# Patient Record
Sex: Female | Born: 1970 | Race: White | Hispanic: No | Marital: Married | Smoking: Never smoker
Health system: Southern US, Community
[De-identification: ages and names within clinical notes are randomized; demographics above are authoritative.]

## PROBLEM LIST (undated history)

## (undated) DIAGNOSIS — M459 Ankylosing spondylitis of unspecified sites in spine: Secondary | ICD-10-CM

## (undated) DIAGNOSIS — J45909 Unspecified asthma, uncomplicated: Secondary | ICD-10-CM

## (undated) DIAGNOSIS — B029 Zoster without complications: Secondary | ICD-10-CM

## (undated) DIAGNOSIS — K219 Gastro-esophageal reflux disease without esophagitis: Secondary | ICD-10-CM

## (undated) DIAGNOSIS — F419 Anxiety disorder, unspecified: Secondary | ICD-10-CM

## (undated) DIAGNOSIS — M1990 Unspecified osteoarthritis, unspecified site: Secondary | ICD-10-CM

## (undated) DIAGNOSIS — Z780 Asymptomatic menopausal state: Secondary | ICD-10-CM

## (undated) DIAGNOSIS — E785 Hyperlipidemia, unspecified: Secondary | ICD-10-CM

## (undated) DIAGNOSIS — K449 Diaphragmatic hernia without obstruction or gangrene: Secondary | ICD-10-CM

## (undated) HISTORY — PX: CARPAL TUNNEL RELEASE: SHX101

## (undated) HISTORY — DX: Anxiety disorder, unspecified: F41.9

## (undated) HISTORY — DX: Unspecified asthma, uncomplicated: J45.909

## (undated) HISTORY — PX: GALLBLADDER SURGERY: SHX652

## (undated) HISTORY — PX: ABDOMINAL HYSTERECTOMY: SHX81

## (undated) HISTORY — DX: Gastro-esophageal reflux disease without esophagitis: K21.9

## (undated) HISTORY — PX: KNEE SURGERY: SHX244

## (undated) HISTORY — DX: Hyperlipidemia, unspecified: E78.5

## (undated) HISTORY — PX: BREAST SURGERY: SHX581

## (undated) HISTORY — DX: Ankylosing spondylitis of unspecified sites in spine: M45.9

## (undated) HISTORY — PX: CHOLECYSTECTOMY: SHX55

## (undated) HISTORY — DX: Asymptomatic menopausal state: Z78.0

## (undated) HISTORY — PX: TOTAL ABDOMINAL HYSTERECTOMY: SHX209

## (undated) HISTORY — DX: Zoster without complications: B02.9

## (undated) HISTORY — DX: Unspecified osteoarthritis, unspecified site: M19.90

## (undated) HISTORY — DX: Diaphragmatic hernia without obstruction or gangrene: K44.9

---

## 2020-06-14 DIAGNOSIS — Z20828 Contact with and (suspected) exposure to other viral communicable diseases: Secondary | ICD-10-CM | POA: Diagnosis not present

## 2020-06-18 DIAGNOSIS — Z20828 Contact with and (suspected) exposure to other viral communicable diseases: Secondary | ICD-10-CM | POA: Diagnosis not present

## 2020-07-01 DIAGNOSIS — N951 Menopausal and female climacteric states: Secondary | ICD-10-CM | POA: Diagnosis not present

## 2020-07-01 DIAGNOSIS — R635 Abnormal weight gain: Secondary | ICD-10-CM | POA: Diagnosis not present

## 2020-07-01 LAB — CBC AND DIFFERENTIAL
HCT: 44 (ref 36–46)
Hemoglobin: 14.3 (ref 12.0–16.0)
Platelets: 361 (ref 150–399)
WBC: 8.9

## 2020-07-01 LAB — HEPATIC FUNCTION PANEL
ALT: 17 (ref 7–35)
AST: 13 (ref 13–35)
Alkaline Phosphatase: 91 (ref 25–125)

## 2020-07-01 LAB — TSH: TSH: 2.03 (ref 0.41–5.90)

## 2020-07-01 LAB — BASIC METABOLIC PANEL
CO2: 22 (ref 13–22)
Chloride: 105 (ref 99–108)
Creatinine: 0.8 (ref 0.5–1.1)
Glucose: 101
Potassium: 4.5 (ref 3.4–5.3)
Sodium: 142 (ref 137–147)

## 2020-07-01 LAB — COMPREHENSIVE METABOLIC PANEL: Albumin: 4.6 (ref 3.5–5.0)

## 2020-07-01 LAB — VITAMIN D 25 HYDROXY (VIT D DEFICIENCY, FRACTURES): Vit D, 25-Hydroxy: 10.4

## 2020-07-01 LAB — IRON,TIBC AND FERRITIN PANEL
Ferritin: 86
Iron: 68

## 2020-07-01 LAB — CBC: RBC: 4.93 (ref 3.87–5.11)

## 2020-07-01 LAB — HEMOGLOBIN A1C: Hemoglobin A1C: 5.4

## 2020-07-04 LAB — LIPID PANEL
Cholesterol: 298 — AB (ref 0–200)
HDL: 37 (ref 35–70)
Triglycerides: 481 — AB (ref 40–160)

## 2020-07-05 DIAGNOSIS — N951 Menopausal and female climacteric states: Secondary | ICD-10-CM | POA: Diagnosis not present

## 2020-07-05 DIAGNOSIS — Z1331 Encounter for screening for depression: Secondary | ICD-10-CM | POA: Diagnosis not present

## 2020-07-05 DIAGNOSIS — K219 Gastro-esophageal reflux disease without esophagitis: Secondary | ICD-10-CM | POA: Diagnosis not present

## 2020-07-05 DIAGNOSIS — R7989 Other specified abnormal findings of blood chemistry: Secondary | ICD-10-CM | POA: Diagnosis not present

## 2020-07-05 DIAGNOSIS — E7800 Pure hypercholesterolemia, unspecified: Secondary | ICD-10-CM | POA: Diagnosis not present

## 2020-07-05 DIAGNOSIS — Z1339 Encounter for screening examination for other mental health and behavioral disorders: Secondary | ICD-10-CM | POA: Diagnosis not present

## 2020-07-15 DIAGNOSIS — Z6835 Body mass index (BMI) 35.0-35.9, adult: Secondary | ICD-10-CM | POA: Diagnosis not present

## 2020-07-15 DIAGNOSIS — N393 Stress incontinence (female) (male): Secondary | ICD-10-CM | POA: Diagnosis not present

## 2020-07-15 DIAGNOSIS — E7800 Pure hypercholesterolemia, unspecified: Secondary | ICD-10-CM | POA: Diagnosis not present

## 2020-07-15 DIAGNOSIS — N951 Menopausal and female climacteric states: Secondary | ICD-10-CM | POA: Diagnosis not present

## 2020-07-21 DIAGNOSIS — Z6836 Body mass index (BMI) 36.0-36.9, adult: Secondary | ICD-10-CM | POA: Diagnosis not present

## 2020-07-21 DIAGNOSIS — E7800 Pure hypercholesterolemia, unspecified: Secondary | ICD-10-CM | POA: Diagnosis not present

## 2020-07-27 NOTE — Progress Notes (Signed)
New Patient Office Visit  Subjective:  Patient ID: Breanna [name], female    DOB: [birth year]  Age: 50 y.o. MRN: [id number]  CC:  Chief Complaint  Patient presents with  . Establish Care    HPI Breanna [name] presents to establish care.   Hyperlipidemia with elevated triglycerides-she brought labs from [facility] today which shows a total cholesterol of 298 with triglycerides of 481.  She was previously treated for hyperlipidemia with simvastatin which she tolerated well without side effects.  Unfortunately this was not super effective at reducing her cholesterol so she was switched to Lipitor.  She did not tolerate that at all and had significant nausea.  Since then she has not been taking any cholesterol medications although she is trying to make some dietary changes.  She is amenable to restarting a statin medication today.  History of vitamin D deficiency with last check in January 2010 0.4.  She is currently taking 5000 units of vitamin D daily.  Ankylosing spondylitis-she was previously managed by rheumatology and received Humira to treat this.  She has been off of this for over a year and has not been reconnected with her rheumatologist since her relocation from [place] to [place].  She would like a rheumatology referral today.  Mood-notes that she does struggle with anxiety more than depression.  When she is anxious she begins to pick her fingers around the cuticle areas.  At times she has picked these role and even drawing blood.  She does not sleep well, trouble with onset as well as maintenance.  She is not taking any medications for anxiety or depression but has in the past and did not tolerate them well.  Most notably, she took Lexapro as well as Prozac at one point.  Denies SI/HI.  Class II obesity-she is working with [facility] on enacting a weight loss plan and is aware that she does need to lose weight for her best health.  Past Medical History:  Diagnosis Date  . Ankylosing  spondylitis (HCC)   . Anxiety   . Arthritis   . Asthma   . GERD (gastroesophageal reflux disease)   . Hiatal hernia   . Hyperlipidemia   . Menopause   . Shingles     Past Surgical History:  Procedure Laterality Date  . ABDOMINAL HYSTERECTOMY    . BREAST SURGERY    . CARPAL TUNNEL RELEASE    . GALLBLADDER SURGERY    . KNEE SURGERY      Family History  Problem Relation Age of Onset  . Hypertension Mother   . Heart attack Father     Social History   Socioeconomic History  . Marital status: Married    Spouse name: Not on file  . Number of children: 1  . Years of education: Not on file  . Highest education level: Not on file  Occupational History  . Occupation: Scientist, product/process development: [employer]  Tobacco Use  . Smoking status: Never Smoker  . Smokeless tobacco: Never Used  Vaping Use  . Vaping Use: Never used  Substance and Sexual Activity  . Alcohol use: Yes  . Drug use: Never  . Sexual activity: Yes    Birth control/protection: Surgical  Other Topics Concern  . Not on file  Social History Narrative  . Not on file   Social Determinants of Health   Financial Resource Strain: Not on file  Food Insecurity: Not on file  Transportation Needs: Not  on file  Physical Activity: Not on file  Stress: Not on file  Social Connections: Not on file  Intimate Partner Violence: Not on file    ROS Review of Systems  Constitutional: Positive for fatigue. Negative for chills, fever and unexpected weight change.  Respiratory: Negative for cough, chest tightness, shortness of breath and wheezing.   Cardiovascular: Negative for chest pain, palpitations and leg swelling.  Musculoskeletal: Positive for arthralgias, back pain and neck pain.  Neurological: Negative for dizziness, light-headedness and headaches.  Psychiatric/Behavioral: Positive for dysphoric mood and sleep disturbance. Negative for self-injury and suicidal ideas. The patient is  nervous/anxious.     Objective:   Today's Vitals: BP 135/81 (BP Location: Right Arm, Patient Position: Sitting, Cuff Size: Normal)   Pulse 78   Temp 98.1 F (36.7 C) (Oral)   Resp 20   Ht 5\' 3"  (1.6 m)   Wt 203 lb (92.1 kg)   SpO2 99%   BMI 35.96 kg/m   Physical Exam Vitals reviewed.  Constitutional:      General: She is not in acute distress.    Appearance: Normal appearance.  HENT:     Head: Normocephalic and atraumatic.  Cardiovascular:     Rate and Rhythm: Normal rate and regular rhythm.     Pulses: Normal pulses.     Heart sounds: Normal heart sounds. No murmur heard. No friction rub. No gallop.   Pulmonary:     Effort: Pulmonary effort is normal. No respiratory distress.     Breath sounds: Normal breath sounds. No wheezing.  Skin:    General: Skin is warm and dry.  Neurological:     Mental Status: She is alert and oriented to person, place, and time.  Psychiatric:        Mood and Affect: Mood normal.        Behavior: Behavior normal.        Thought Content: Thought content normal.        Judgment: Judgment normal.     Assessment & Plan:   1. Encounter to establish care Reviewed available information and discussed healthcare concerns with patient.  2. Mixed hyperlipidemia Discussed different statin medications that we may try.  She is amenable to trying Crestor so we are starting at a low dose of 10 mg daily.  Plan to recheck lipid panel and liver function in approximately 6 weeks.  3. Mild intermittent asthma without complication Refilling albuterol inhaler for as needed use  4. Ankylosing spondylitis of multiple sites in spine [facility]) Referral to rheumatology entered. - Ambulatory referral to Rheumatology  5. Anxiety Discussed treatment options with patient.  Since she did not tolerate SSRIs in the past.  Looking at other alternatives.  With her history of poor sleep and chronic body aches and pains, think she would benefit from a trial of  amitriptyline.  Starting at a low dose of 10 mg nightly and titrating up to 30 mg nightly over the next 3 weeks.  6. Obesity, Class II, BMI 35-39.9 She is working with [facility] for weight loss plan.  Outpatient Encounter Medications as of 07/28/2020  Medication Sig  . albuterol (VENTOLIN HFA) 108 (90 Base) MCG/ACT inhaler Inhale 2 puffs into the lungs every 6 (six) hours as needed for wheezing.  07/30/2020 amitriptyline (ELAVIL) 10 MG tablet Take 1-3 tablets (10-30 mg total) by mouth at bedtime. Take 10mg  nightly for 1 week then increase to 20mg  nightly. If doing well, stay at 20mg  nightly. If not, go up  to 30mg  nightly.  . Estradiol 6 MG PLLT Take by mouth.  . Ibuprofen-Acetaminophen (ADVIL DUAL ACTION PO) Take by mouth.  . rosuvastatin (CRESTOR) 10 MG tablet Take 1 tablet (10 mg total) by mouth daily.  . Testosterone 100 MG PLLT Take by mouth.  . Testosterone 25 MG PLLT Take by mouth.  . [DISCONTINUED] Albuterol Sulfate (PROAIR HFA IN) Inhale into the lungs.   No facility-administered encounter medications on file as of 07/28/2020.    Follow-up: Return in about 4 weeks (around 08/25/2020) for mood follow up.   08/27/2020, DNP, APRN, FNP-BC [facility]

## 2020-07-28 ENCOUNTER — Encounter: Payer: Self-pay | Admitting: Medical-Surgical

## 2020-07-28 ENCOUNTER — Other Ambulatory Visit: Payer: Self-pay

## 2020-07-28 ENCOUNTER — Ambulatory Visit (INDEPENDENT_AMBULATORY_CARE_PROVIDER_SITE_OTHER): Payer: BC Managed Care – PPO | Admitting: Medical-Surgical

## 2020-07-28 VITALS — BP 135/81 | HR 78 | Temp 98.1°F | Resp 20 | Ht 63.0 in | Wt 203.0 lb

## 2020-07-28 DIAGNOSIS — E782 Mixed hyperlipidemia: Secondary | ICD-10-CM | POA: Diagnosis not present

## 2020-07-28 DIAGNOSIS — J4520 Mild intermittent asthma, uncomplicated: Secondary | ICD-10-CM

## 2020-07-28 DIAGNOSIS — F419 Anxiety disorder, unspecified: Secondary | ICD-10-CM

## 2020-07-28 DIAGNOSIS — M450 Ankylosing spondylitis of multiple sites in spine: Secondary | ICD-10-CM | POA: Diagnosis not present

## 2020-07-28 DIAGNOSIS — E785 Hyperlipidemia, unspecified: Secondary | ICD-10-CM | POA: Insufficient documentation

## 2020-07-28 DIAGNOSIS — M459 Ankylosing spondylitis of unspecified sites in spine: Secondary | ICD-10-CM | POA: Insufficient documentation

## 2020-07-28 DIAGNOSIS — E669 Obesity, unspecified: Secondary | ICD-10-CM | POA: Insufficient documentation

## 2020-07-28 DIAGNOSIS — Z7689 Persons encountering health services in other specified circumstances: Secondary | ICD-10-CM

## 2020-07-28 DIAGNOSIS — M4690 Unspecified inflammatory spondylopathy, site unspecified: Secondary | ICD-10-CM | POA: Insufficient documentation

## 2020-07-28 DIAGNOSIS — J45909 Unspecified asthma, uncomplicated: Secondary | ICD-10-CM | POA: Insufficient documentation

## 2020-07-28 MED ORDER — ALBUTEROL SULFATE HFA 108 (90 BASE) MCG/ACT IN AERS: 2.0000 | INHALATION_SPRAY | Freq: Four times a day (QID) | RESPIRATORY_TRACT | 11 refills | Status: DC | PRN

## 2020-07-28 MED ORDER — ROSUVASTATIN CALCIUM 10 MG PO TABS
10.0000 mg | ORAL_TABLET | Freq: Every day | ORAL | 3 refills | Status: DC
Start: 2020-07-28 — End: 2020-09-20

## 2020-07-28 MED ORDER — AMITRIPTYLINE HCL 10 MG PO TABS: 10.0000 mg | ORAL_TABLET | Freq: Every day | ORAL | 1 refills | Status: DC

## 2020-07-29 DIAGNOSIS — Z6836 Body mass index (BMI) 36.0-36.9, adult: Secondary | ICD-10-CM | POA: Diagnosis not present

## 2020-07-29 DIAGNOSIS — K219 Gastro-esophageal reflux disease without esophagitis: Secondary | ICD-10-CM | POA: Diagnosis not present

## 2020-08-25 ENCOUNTER — Encounter: Payer: Self-pay | Admitting: Medical-Surgical

## 2020-08-25 ENCOUNTER — Other Ambulatory Visit: Payer: Self-pay

## 2020-08-25 ENCOUNTER — Ambulatory Visit: Payer: BC Managed Care – PPO | Admitting: Medical-Surgical

## 2020-08-25 VITALS — BP 117/77 | HR 82 | Temp 98.9°F | Ht 63.0 in | Wt 203.9 lb

## 2020-08-25 DIAGNOSIS — F419 Anxiety disorder, unspecified: Secondary | ICD-10-CM | POA: Diagnosis not present

## 2020-08-25 DIAGNOSIS — E782 Mixed hyperlipidemia: Secondary | ICD-10-CM

## 2020-08-25 NOTE — Progress Notes (Signed)
Subjective:    CC: anxiety/insomnia, cholesterol  HPI: Pleasant 50 year old female presenting for follow up on the following:   Anxiety/insomnia- taking Amitriptyline 20mg  nightly. Has not increased to 30mg  as prescribed as she was worried about possibly being oversedated. Will be on vacation next week and plans to go up. She is sleeping better overall with fewer waking episodes and less trouble falling back to sleep. Still feels anxious and overwhelmed regularly. Works in and thinks this may be causing some of her anxiety since she feels that she has to hide her real self while at work.   Cholesterol- started Crestor 10mg  daily, tolerating well. No side effects like when she took Lipitor.   I reviewed the past medical history, family history, social history, surgical history, and allergies today and no changes were needed.  Please see the problem list section below in epic for further details.  Past Medical History: Past Medical History:  Diagnosis Date  . Ankylosing spondylitis (HCC)   . Anxiety   . Arthritis   . Asthma   . GERD (gastroesophageal reflux disease)   . Hiatal hernia   . Hyperlipidemia   . Menopause   . Shingles    Past Surgical History: Past Surgical History:  Procedure Laterality Date  . ABDOMINAL HYSTERECTOMY    . BREAST SURGERY    . CARPAL TUNNEL RELEASE    . GALLBLADDER SURGERY    . KNEE SURGERY     Social History: Social History   Socioeconomic History  . Marital status: Married    Spouse name: Not on file  . Number of children: 1  . Years of education: Not on file  . Highest education level: Not on file  Occupational History  . Occupation: : [employer]  Tobacco Use  . Smoking status: Never Smoker  . Smokeless tobacco: Never Used  Vaping Use  . Vaping Use: Never used  Substance and Sexual Activity  . Alcohol use: Yes  . Drug use: Never  . Sexual activity: Yes    Birth control/protection:  Surgical  Other Topics Concern  . Not on file  Social History Narrative  . Not on file   Social Determinants of Health   Financial Resource Strain: Not on file  Food Insecurity: Not on file  Transportation Needs: Not on file  Physical Activity: Not on file  Stress: Not on file  Social Connections: Not on file   Family History: Family History  Problem Relation Age of Onset  . Hypertension Mother   . Heart attack Father    Allergies: Allergies  Allergen Reactions  . Lexapro [Escitalopram] Other (See Comments)    Weight gain, sweating  . Lipitor [Atorvastatin] Nausea Only  . Meloxicam Nausea Only  . Phendimetrazine Tartrate Anxiety   Medications: See med rec.  Review of Systems: See HPI for pertinent positives and negatives.   Objective:    General: Well Developed, well nourished, and in no acute distress.  Neuro: Alert and oriented x3.  HEENT: Normocephalic, atraumatic.  Skin: Warm and dry. Cardiac: Regular rate and rhythm, no murmurs rubs or gallops, no lower extremity edema.  Respiratory: Clear to auscultation bilaterally. Not using accessory muscles, speaking in full sentences.   Impression and Recommendations:    1. Anxiety/insomnia Recommend increasing amitriptyline as previously discussed.  Advised that treating anxiety with these medications often requires a bit higher of a dose.  If she is tolerating the 20 mg, I do not  feel that 30 mg will make her oversedated.  If she goes on vacation and tries 30 mg and tolerates this well but does not see much improvement in her anxiety, recommend increasing to 40.  An effective dose for her may be closer to 50 mg but we will move in slow increments to evaluate tolerance.   2. Mixed hyperlipidemia Continue Crestor 10 mg daily.  Plan to have labs rechecked for liver function and cholesterol panel in another 2 weeks. - COMPLETE METABOLIC PANEL WITH GFR - Lipid panel  Return in about 4 weeks (around 09/22/2020) for  mood/insomnia follow up. ___________________________________________ Thayer Ohm, DNP, APRN, FNP-BC Primary Care and Sports Medicine [facility] [place]

## 2020-09-01 ENCOUNTER — Encounter: Payer: Self-pay | Admitting: Medical-Surgical

## 2020-09-16 DIAGNOSIS — E782 Mixed hyperlipidemia: Secondary | ICD-10-CM | POA: Diagnosis not present

## 2020-09-17 LAB — LIPID PANEL
Cholesterol: 258 mg/dL — ABNORMAL HIGH (ref <200)
HDL: 36 mg/dL — ABNORMAL LOW (ref >=50)
Non-HDL Cholesterol (Calc): 222 mg/dL (calc) — ABNORMAL HIGH (ref <130)
Total CHOL/HDL Ratio: 7.2 (calc) — ABNORMAL HIGH (ref <5.0)
Triglycerides: 823 mg/dL — ABNORMAL HIGH (ref <150)

## 2020-09-17 LAB — COMPLETE METABOLIC PANEL WITH GFR
AG Ratio: 1.8 (calc) (ref 1.0–2.5)
ALT: 21 U/L (ref 6–29)
AST: 15 U/L (ref 10–35)
Albumin: 4.8 g/dL (ref 3.6–5.1)
Alkaline phosphatase (APISO): 90 U/L (ref 31–125)
BUN: 13 mg/dL (ref 7–25)
CO2: 27 mmol/L (ref 20–32)
Calcium: 9.6 mg/dL (ref 8.6–10.2)
Chloride: 102 mmol/L (ref 98–110)
Creat: 0.9 mg/dL (ref 0.50–1.10)
GFR, Est African American: 87 mL/min/{1.73_m2} (ref >=60)
GFR, Est Non African American: 75 mL/min/{1.73_m2} (ref >=60)
Globulin: 2.7 g/dL (calc) (ref 1.9–3.7)
Glucose, Bld: 108 mg/dL — ABNORMAL HIGH (ref 65–99)
Potassium: 4.5 mmol/L (ref 3.5–5.3)
Sodium: 139 mmol/L (ref 135–146)
Total Bilirubin: 0.5 mg/dL (ref 0.2–1.2)
Total Protein: 7.5 g/dL (ref 6.1–8.1)

## 2020-09-20 MED ORDER — ROSUVASTATIN CALCIUM 20 MG PO TABS: 20.0000 mg | ORAL_TABLET | Freq: Every day | ORAL | 3 refills | Status: DC

## 2020-09-20 NOTE — Addendum Note (Signed)
Addended byChristen Butter on: 09/20/2020 08:31 AM   Modules accepted: Orders

## 2020-09-22 ENCOUNTER — Ambulatory Visit: Payer: BC Managed Care – PPO | Admitting: Medical-Surgical

## 2020-09-22 ENCOUNTER — Ambulatory Visit (INDEPENDENT_AMBULATORY_CARE_PROVIDER_SITE_OTHER): Payer: BC Managed Care – PPO

## 2020-09-22 ENCOUNTER — Other Ambulatory Visit: Payer: Self-pay

## 2020-09-22 ENCOUNTER — Encounter: Payer: Self-pay | Admitting: Medical-Surgical

## 2020-09-22 VITALS — BP 136/84 | HR 74 | Temp 97.9°F | Resp 20 | Ht 63.0 in | Wt 204.0 lb

## 2020-09-22 DIAGNOSIS — M25512 Pain in left shoulder: Secondary | ICD-10-CM

## 2020-09-22 DIAGNOSIS — F419 Anxiety disorder, unspecified: Secondary | ICD-10-CM

## 2020-09-22 DIAGNOSIS — F5101 Primary insomnia: Secondary | ICD-10-CM

## 2020-09-22 IMAGING — DX DG SHOULDER 2+V*L*
3 series · 3 of 3 positions shown · non-contrast
Comparison: None.

CLINICAL DATA: Left shoulder pain.

EXAM:
LEFT SHOULDER - 2+ VIEW

[shoulder grashey]
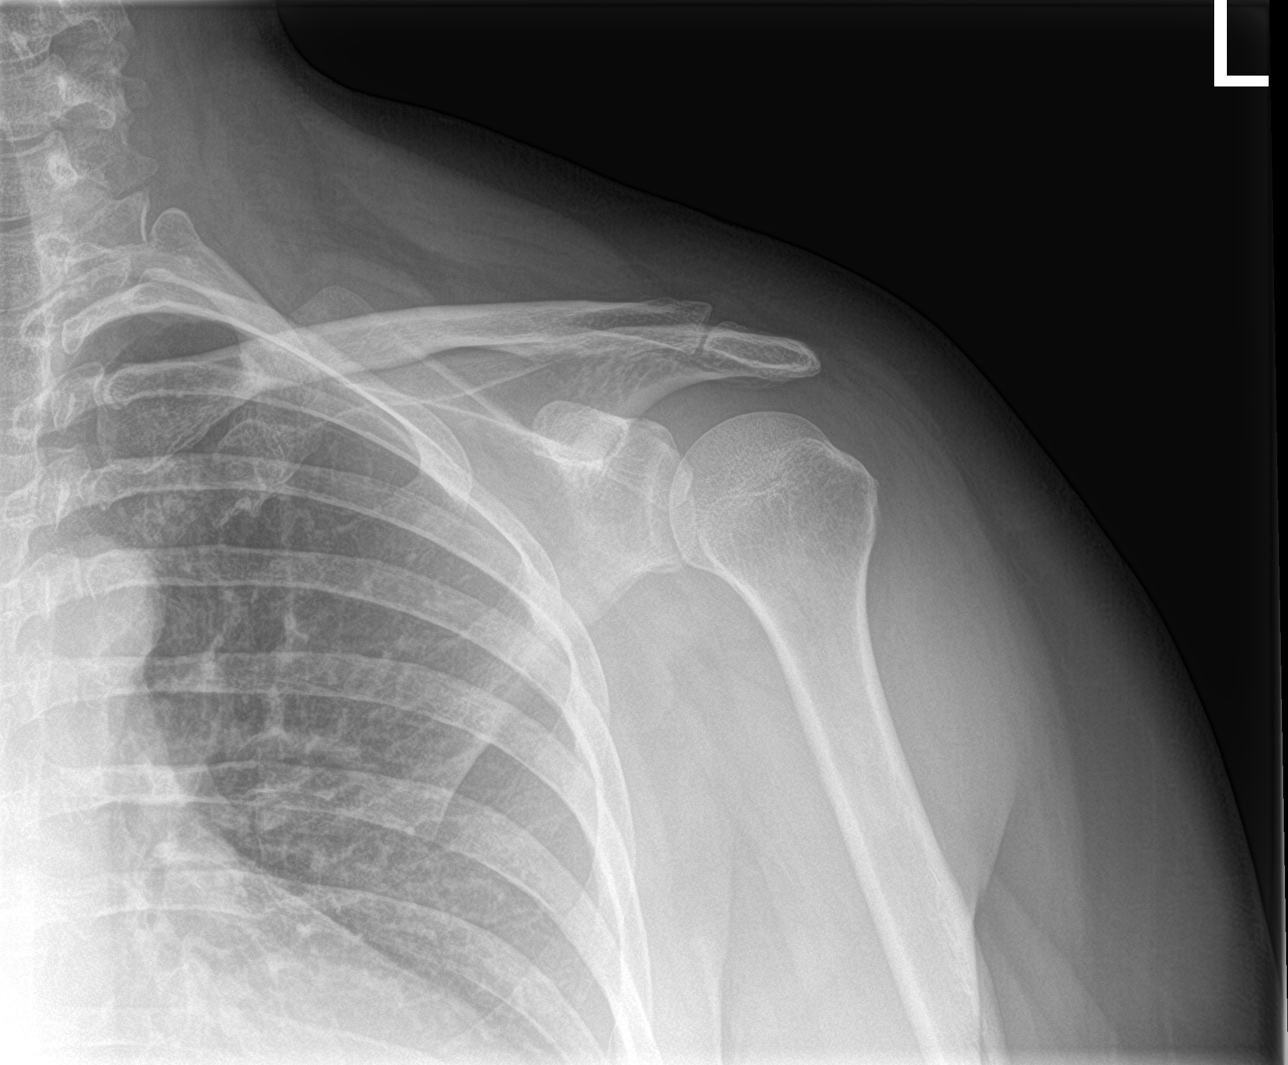

[shoulder y view]
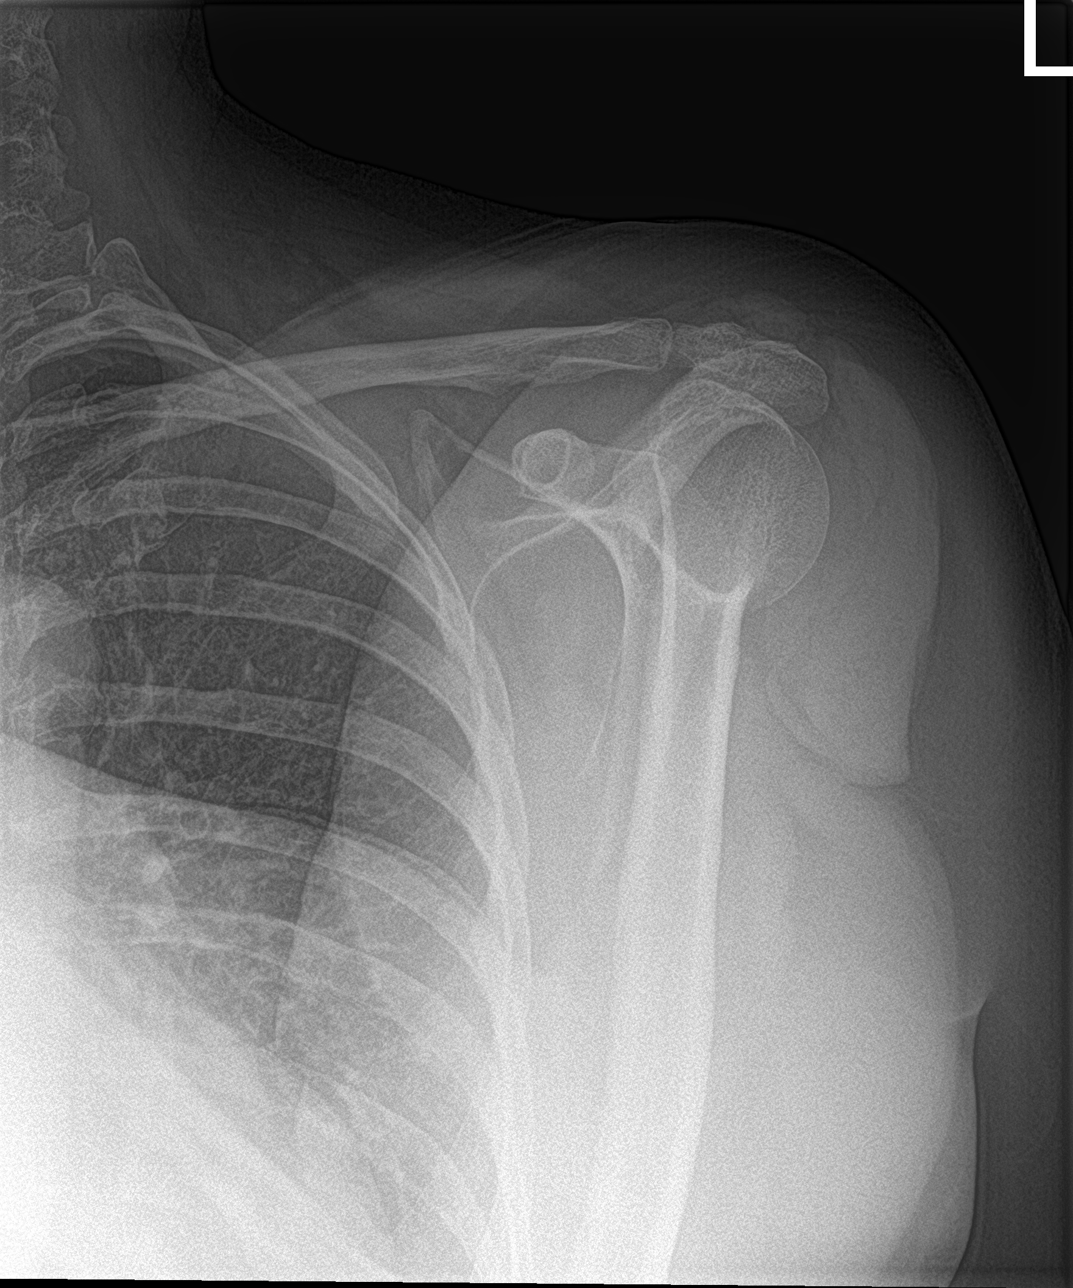

[shoulder axillary]
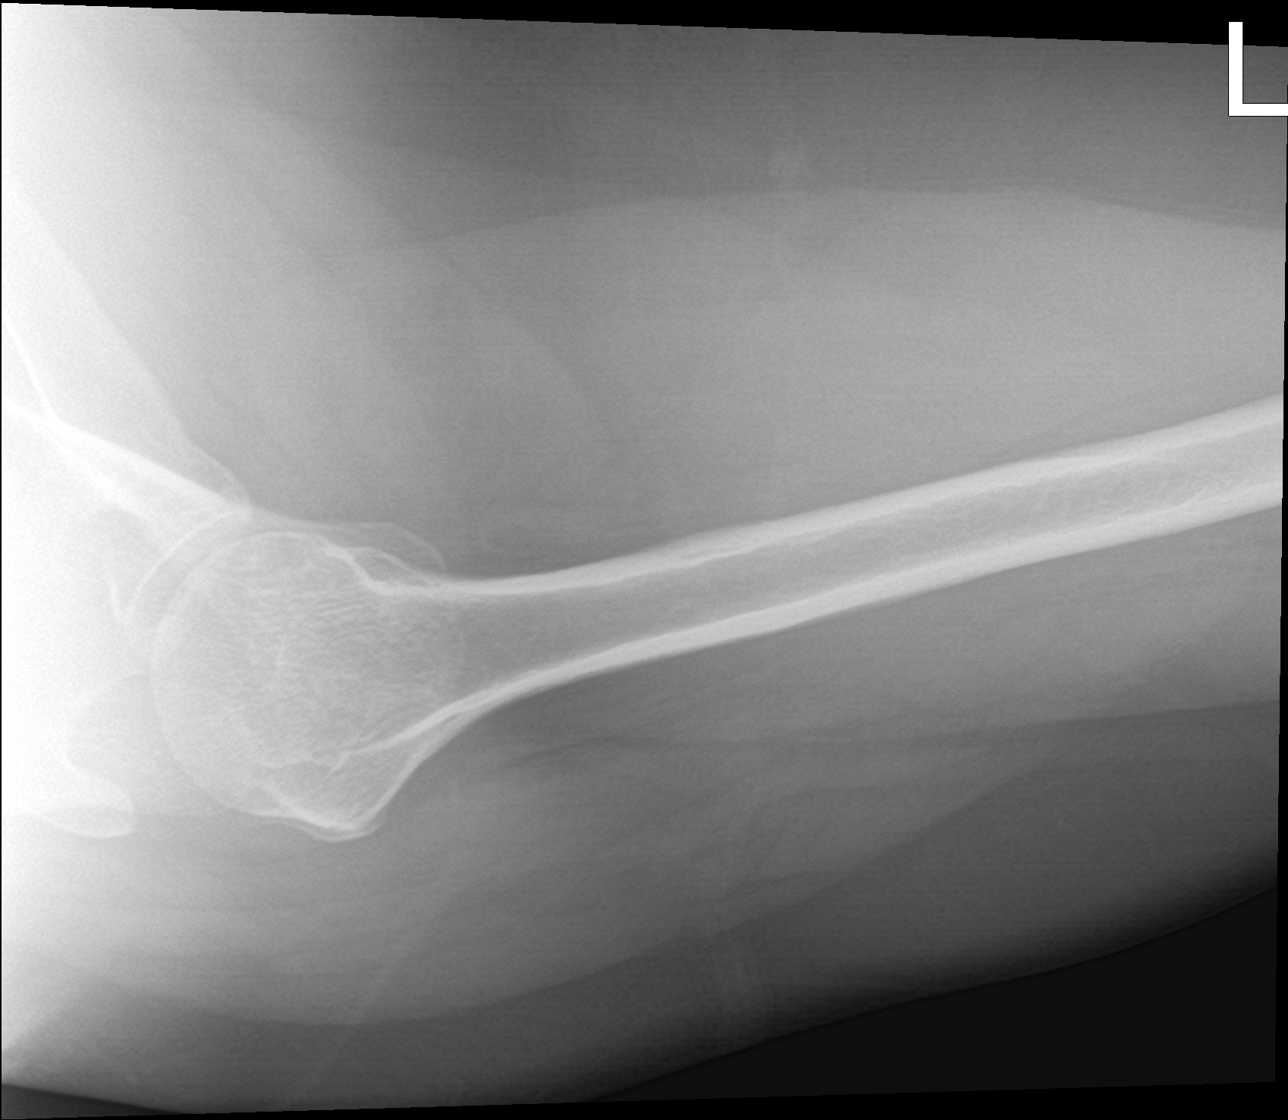

[3 of 3 positions shown; findings below may reference images not displayed]

FINDINGS: There is no evidence of fracture or dislocation. There is no
evidence of arthropathy or other focal bone abnormality. Soft
tissues are unremarkable.
IMPRESSION: Negative.

## 2020-09-22 MED ORDER — AMITRIPTYLINE HCL 10 MG PO TABS: 10.0000 mg | ORAL_TABLET | Freq: Every day | ORAL | 2 refills | Status: DC

## 2020-09-22 MED ORDER — CELECOXIB 200 MG PO CAPS: 200.0000 mg | ORAL_CAPSULE | Freq: Two times a day (BID) | ORAL | 2 refills | Status: DC | PRN

## 2020-09-22 NOTE — Progress Notes (Signed)
Subjective:    CC: mood/insomnia follow up  HPI: Pleasant 50 year old female presenting for mood and insomnia follow up. Taking Amitriptyline 30mg  nightly for the last couple of weeks. Tolerating the medication well. Notes that the 20mg  dose was not enough to fall asleep easier at night. The 30mg  dose works well and she is able to fall asleep and get back to sleep without difficulty. Unfortunately, this dose leaves her feeling tired during the day. Reports that this is a good trade off since sleep is a bigger priority. Feels like her anxiety has improved some. Denies SI/HI.  Left shoulder pain over the last couple of years intermittently but has been acute x 2 weeks. Hurts every day, some days more than others. Pain at the rotator cuff with flexion of the joint. Hurts to lay on it at night and wakes her if she rolls over on it. Dual action advil sometimes helps. No known injury.   I reviewed the past medical history, family history, social history, surgical history, and allergies today and no changes were needed.  Please see the problem list section below in epic for further details.  Past Medical History: Past Medical History:  Diagnosis Date  . Ankylosing spondylitis (HCC)   . Anxiety   . Arthritis   . Asthma   . GERD (gastroesophageal reflux disease)   . Hiatal hernia   . Hyperlipidemia   . Menopause   . Shingles    Past Surgical History: Past Surgical History:  Procedure Laterality Date  . ABDOMINAL HYSTERECTOMY    . BREAST SURGERY    . CARPAL TUNNEL RELEASE    . GALLBLADDER SURGERY    . KNEE SURGERY     Social History: Social History   Socioeconomic History  . Marital status: Married    Spouse name: Not on file  . Number of children: 1  . Years of education: Not on file  . Highest education level: Not on file  Occupational History  . Occupation: : [employer]  Tobacco Use  . Smoking status: Never Smoker  . Smokeless  tobacco: Never Used  Vaping Use  . Vaping Use: Never used  Substance and Sexual Activity  . Alcohol use: Yes  . Drug use: Never  . Sexual activity: Yes    Birth control/protection: Surgical  Other Topics Concern  . Not on file  Social History Narrative  . Not on file   Social Determinants of Health   Financial Resource Strain: Not on file  Food Insecurity: Not on file  Transportation Needs: Not on file  Physical Activity: Not on file  Stress: Not on file  Social Connections: Not on file   Family History: Family History  Problem Relation Age of Onset  . Hypertension Mother   . Heart attack Father    Allergies: Allergies  Allergen Reactions  . Lexapro [Escitalopram] Other (See Comments)    Weight gain, sweating  . Lipitor [Atorvastatin] Nausea Only  . Meloxicam Nausea Only  . Phendimetrazine Tartrate Anxiety   Medications: See med rec.  Review of Systems: See HPI for pertinent positives and negatives.   Depression screen [facility] 2/9 09/22/2020 09/22/2020 08/25/2020 07/28/2020  Decreased Interest 1 1 1 1   Down, Depressed, Hopeless 0 0 0 1  PHQ - 2 Score 1 1 1 2   Altered sleeping 1 - 2 3  Tired, decreased energy 2 - 3 2  Change in appetite 0 - 0 0  Feeling bad or  failure about yourself  0 - 0 1  Trouble concentrating 1 - 1 2  Moving slowly or fidgety/restless 0 - 0 0  Suicidal thoughts 0 - 0 0  PHQ-9 Score 5 - 7 10  Difficult doing work/chores Somewhat difficult - Somewhat difficult Somewhat difficult   GAD 7 : Generalized Anxiety Score 09/22/2020 08/25/2020 07/28/2020  Nervous, Anxious, on Edge 1 2 2   Control/stop worrying 0 1 2  Worry too much - different things 1 2 2   Trouble relaxing 1 2 3   Restless 0 1 0  Easily annoyed or irritable 1 2 3   Afraid - awful might happen 0 0 1  Total GAD 7 Score 4 10 13   Anxiety Difficulty Somewhat difficult Somewhat difficult Somewhat difficult   Objective:    General: Well Developed, well nourished, and in no acute distress.   Neuro: Alert and oriented x3.  HEENT: Normocephalic, atraumatic.  Skin: Warm and dry, no rashes. Cardiac: Regular rate and rhythm, no murmurs rubs or gallops, no lower extremity edema.  Respiratory: Clear to auscultation bilaterally. Not using accessory muscles, speaking in full sentences.  Impression and Recommendations:    1. Anxiety 2. Primary insomnia Doing well on Amitriptyline. Advised to try taking 25mg  nightly is 30mg  leaves her feeling tired the next day. She would like to stay at this dose for a few more weeks before deciding to make a change. Refill sent to pharmacy.   3. Acute pain of left shoulder Getting x-rays today. Likely a rotator cuff injury or possible tendinitis. Shoulder rehab exercises provided to complete at home. Trial Celebrex 200mg  BID prn. Advised to avoid other ibuprofen containing medications while taking Celebrex. Discussed other conservative measures.  - DG Shoulder Left; Future  Return in about 4 weeks (around 10/20/2020) for shoulder pain if not better; mood and insomnia follow up in 3 months. ___________________________________________ , DNP, APRN, FNP-BC Primary Care and Sports Medicine [facility]

## 2020-10-12 ENCOUNTER — Other Ambulatory Visit: Payer: Self-pay

## 2020-11-18 ENCOUNTER — Encounter: Payer: Self-pay | Admitting: Medical-Surgical

## 2020-11-18 ENCOUNTER — Other Ambulatory Visit: Payer: Self-pay

## 2020-11-18 ENCOUNTER — Ambulatory Visit: Payer: BC Managed Care – PPO | Admitting: Medical-Surgical

## 2020-11-18 ENCOUNTER — Ambulatory Visit (INDEPENDENT_AMBULATORY_CARE_PROVIDER_SITE_OTHER): Payer: BC Managed Care – PPO

## 2020-11-18 VITALS — BP 128/76 | HR 84 | Temp 98.4°F | Ht 63.0 in | Wt 203.4 lb

## 2020-11-18 DIAGNOSIS — M542 Cervicalgia: Secondary | ICD-10-CM

## 2020-11-18 DIAGNOSIS — M25512 Pain in left shoulder: Secondary | ICD-10-CM

## 2020-11-18 DIAGNOSIS — G8929 Other chronic pain: Secondary | ICD-10-CM | POA: Diagnosis not present

## 2020-11-18 DIAGNOSIS — M47812 Spondylosis without myelopathy or radiculopathy, cervical region: Secondary | ICD-10-CM | POA: Diagnosis not present

## 2020-11-18 IMAGING — DX DG CERVICAL SPINE COMPLETE 4+V
6 series · 6 of 6 positions shown · non-contrast
Comparison: None.

CLINICAL DATA: Pain.

EXAM:
CERVICAL SPINE - COMPLETE 4+ VIEW

[c-spine lat]
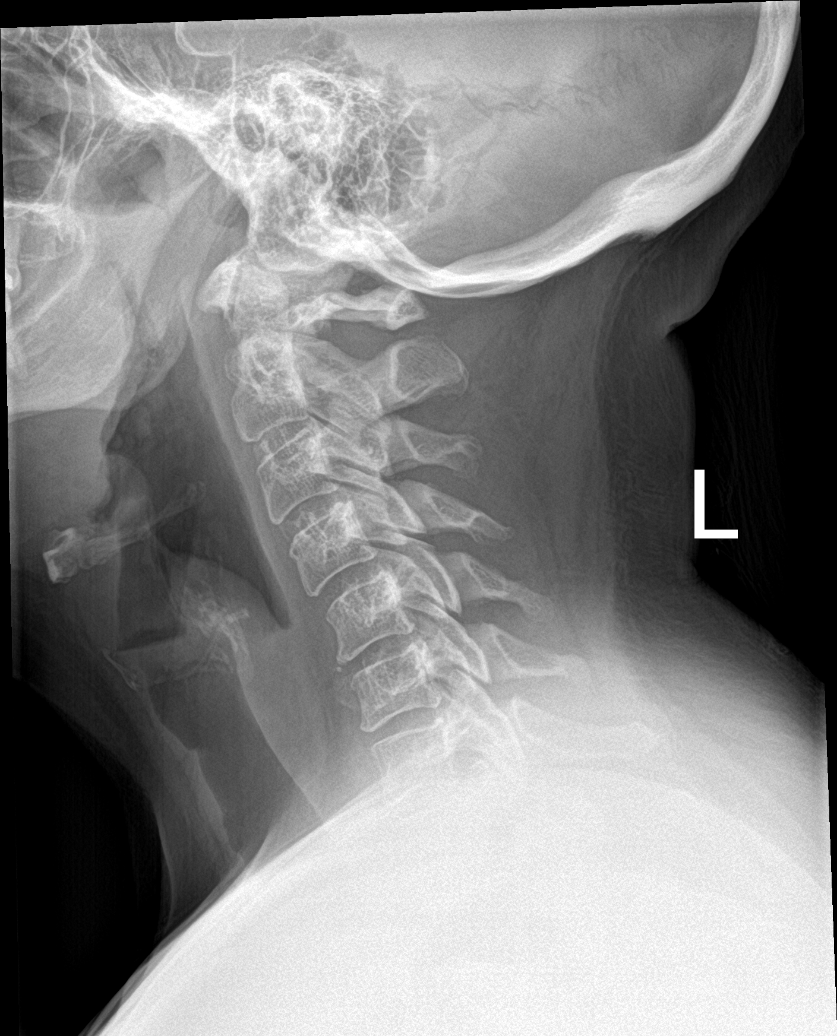

[c-spine obl (1 of 2)]
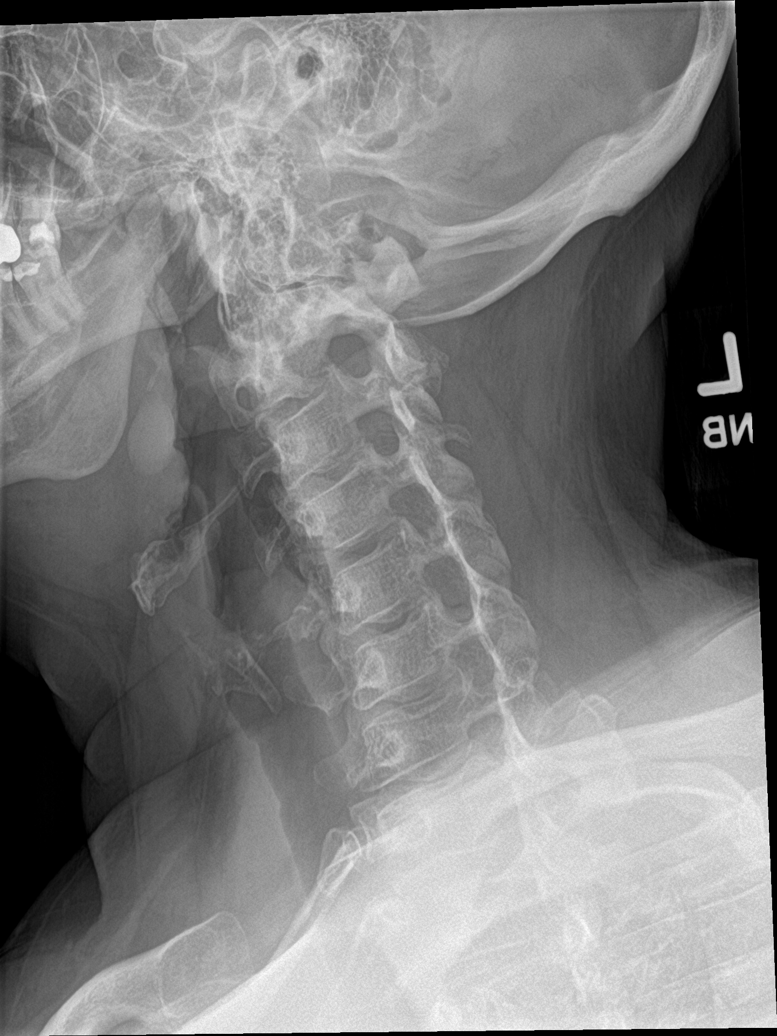

[c-spine obl (2 of 2)]
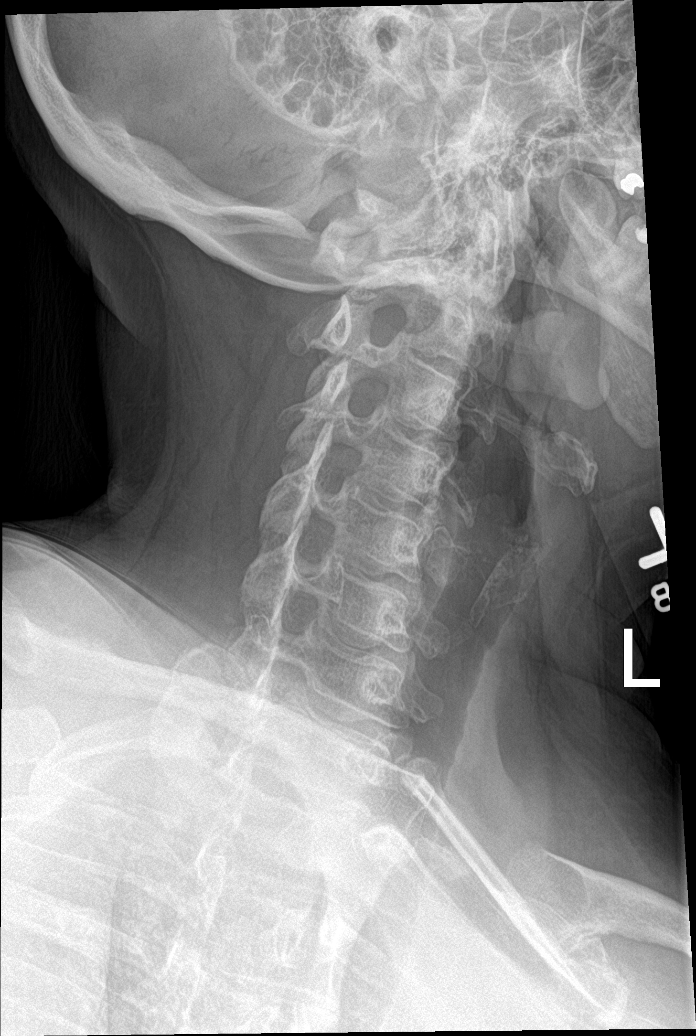

[c-spine ap]
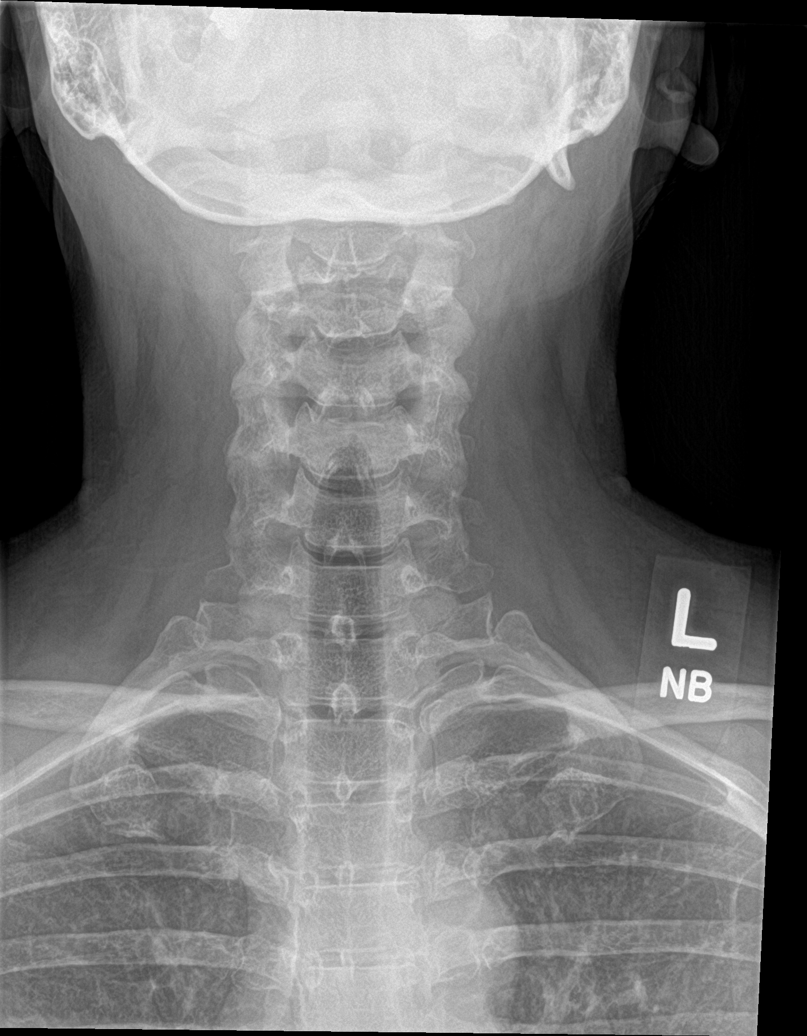

[c-spine open mouth]
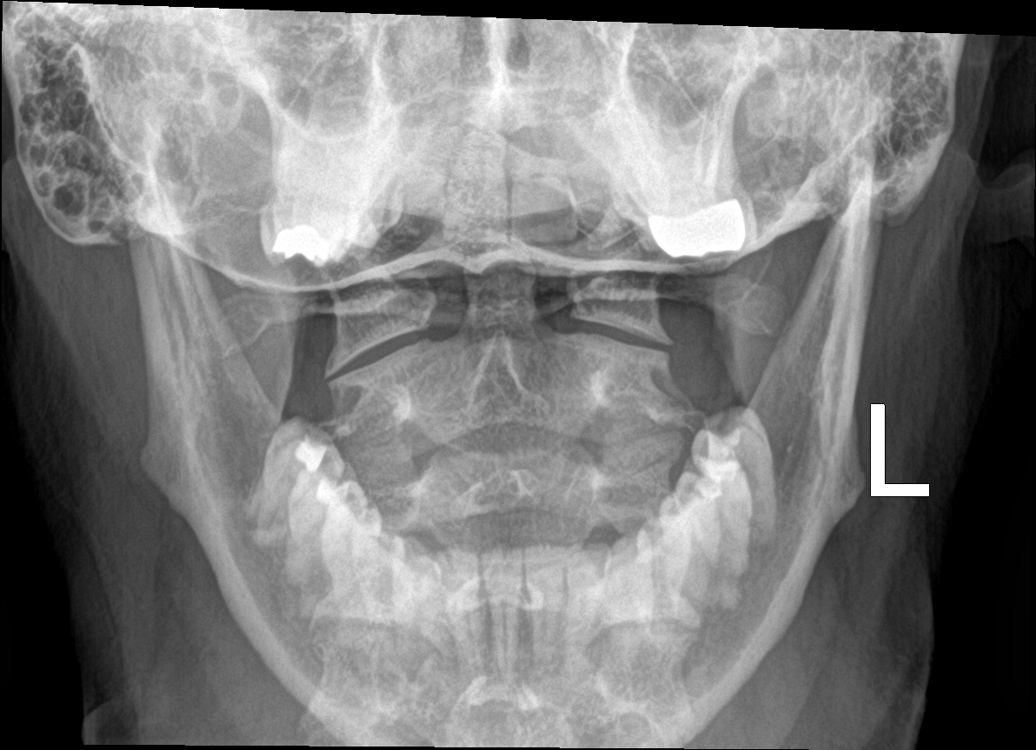

[c-spine swimmers]
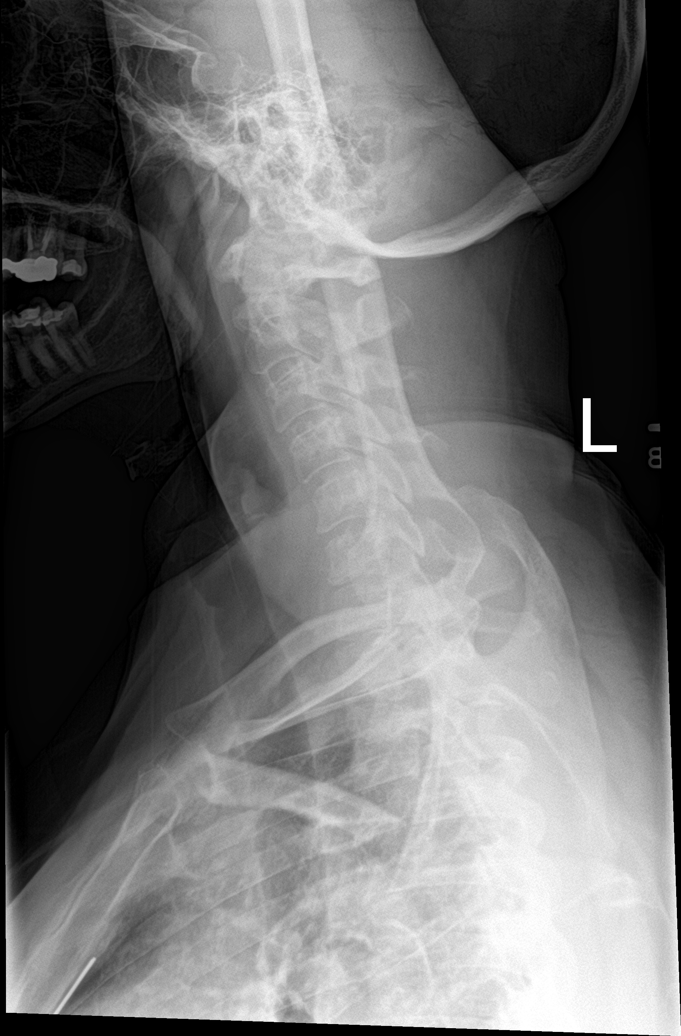

[6 of 6 positions shown; findings below may reference images not displayed]

FINDINGS: Reversal of normal lordosis centered at C5. No other malalignment.
The pre odontoid space and prevertebral soft tissues are normal. The
neural foramina are patent. Minimal degenerative disc disease at
C5-6. No fractures. The odontoid process and lateral masses C1 are
normal. Lung apices are normal.
IMPRESSION: 1. Minimal degenerative changes as above. No other significant
abnormalities.

## 2020-11-18 MED ORDER — PREGABALIN 25 MG PO CAPS: ORAL_CAPSULE | ORAL | 0 refills | Status: DC

## 2020-11-18 NOTE — Progress Notes (Signed)
Subjective:    CC: continued shoulder pain  HPI: Pleasant 50 year old female presenting today for continued left shoulder pain.  She was seen approximately 2 months ago with reports of shoulder pain.  We started her on Celebrex 200 mg twice daily and provided home physical therapy exercises for her to complete.  Notes that the Celebrex has helped quite a bit with her ankylosing spondylitis discomfort but has not helped her left shoulder at all.  She has been doing her exercises regularly with the help of her husband but notes that at times the exercises make the pain worse rather than better.  She is even started trying to do light weight training and strengthening upper body exercises outside of the physical therapy rehab but this has not proven helpful.  She is experiencing the pain constantly with ranges from 2/10 to 7-01/2009.  Her worst pain is at night and especially when she rolls over on her left shoulder.  Range of motion is limited by sharp stabbing pain in the shoulder joint.  Significant reduction in flexion, extension, and abduction is limiting her ability to perform her regular activities at home and work.  She does have a history of neck pain but reports no prior issues with her left shoulder.  No history of injury or trauma to the left shoulder.  She has had a couple of episodes of numbness and tingling extending down to her hand but this has been transient and is not happening every day.  I reviewed the past medical history, family history, social history, surgical history, and allergies today and no changes were needed.  Please see the problem list section below in epic for further details.  Past Medical History: Past Medical History:  Diagnosis Date   Ankylosing spondylitis (HCC)    Anxiety    Arthritis    Asthma    GERD (gastroesophageal reflux disease)    Hiatal hernia    Hyperlipidemia    Menopause    Shingles    Past Surgical History: Past Surgical History:  Procedure  Laterality Date   ABDOMINAL HYSTERECTOMY     BREAST SURGERY     CARPAL TUNNEL RELEASE     GALLBLADDER SURGERY     KNEE SURGERY     Social History: Social History   Socioeconomic History   Marital status: Married    Spouse name: Not on file   Number of children: 1   Years of education: Not on file   Highest education level: Not on file  Occupational History   Occupation: Scientist, product/process development: [employer]  Tobacco Use   Smoking status: Never   Smokeless tobacco: Never  Vaping Use   Vaping Use: Never used  Substance and Sexual Activity   Alcohol use: Yes   Drug use: Never   Sexual activity: Yes    Birth control/protection: Surgical  Other Topics Concern   Not on file  Social History Narrative   Not on file   Social Determinants of Health   Financial Resource Strain: Not on file  Food Insecurity: Not on file  Transportation Needs: Not on file  Physical Activity: Not on file  Stress: Not on file  Social Connections: Not on file   Family History: Family History  Problem Relation Age of Onset   Hypertension Mother    Heart attack Father    Allergies: Allergies  Allergen Reactions   Lexapro [Escitalopram] Other (See Comments)    Weight gain, sweating  Lipitor [Atorvastatin] Nausea Only   Meloxicam Nausea Only   Phendimetrazine Tartrate Anxiety   Medications: See med rec.  Review of Systems: See HPI for pertinent positives and negatives.   Objective:    General: Well Developed, well nourished, and in no acute distress.  Neuro: Alert and oriented x3.  HEENT: Normocephalic, atraumatic.  Skin: Warm and dry. Cardiac: Regular rate and rhythm, no murmurs rubs or gallops, no lower extremity edema.  Respiratory: Clear to auscultation bilaterally. Not using accessory muscles, speaking in full sentences. MSK: Mild tenderness along the paraspinal muscles of the cervical spine as well as along the superior aspect of the trapezius.  Tenderness and  sharp pain reproducible with palpation of the rotator cuff/AC joint.  Marked decrease in range of motion with flexion and abduction limited to about 90 degrees.  Left upper extremity neurovascularly intact, unable to reproduce numbness/tingling of the hand.   Impression and Recommendations:    1. Chronic left shoulder pain She has completed greater than 6 weeks of conservative treatment.  Since conservative treatment has not been helpful for her pain and she is continuing to worsen.  We will go ahead and get MRI without contrast of the left shoulder.  Recommend follow-up once MRI has been completed with Dr. Karie Schwalbe for further evaluation and treatment recommendations. - MR Shoulder Left Wo Contrast; Future  2. Cervical pain She does have a history of some cervical pain which could be contributing to the radiculopathy symptoms she is experiencing intermittently.  We do not have any x-rays on file so updating cervical spine x-rays today.  Referral to rheumatology never completed due to confusion with records.  Verified records are available under media.  Discussed referral with our referral coordinator who has resubmitted the information and faxed over the notes.  For now, continue Celebrex 200 mg twice daily since this seems to be somewhat helpful. - DG Cervical Spine Complete; Future  Return for further evaluation with Dr. Karie Schwalbe after MRI done. ___________________________________________ Thayer Ohm, DNP, APRN, FNP-BC Primary Care and Sports Medicine [facility]

## 2020-11-20 ENCOUNTER — Other Ambulatory Visit: Payer: Self-pay

## 2020-11-20 ENCOUNTER — Ambulatory Visit (INDEPENDENT_AMBULATORY_CARE_PROVIDER_SITE_OTHER): Payer: BC Managed Care – PPO

## 2020-11-20 DIAGNOSIS — G8929 Other chronic pain: Secondary | ICD-10-CM | POA: Diagnosis not present

## 2020-11-20 DIAGNOSIS — M7552 Bursitis of left shoulder: Secondary | ICD-10-CM | POA: Diagnosis not present

## 2020-11-20 DIAGNOSIS — M19012 Primary osteoarthritis, left shoulder: Secondary | ICD-10-CM | POA: Diagnosis not present

## 2020-11-20 DIAGNOSIS — M25512 Pain in left shoulder: Secondary | ICD-10-CM

## 2020-11-20 IMAGING — MR MR SHOULDER*L* W/O CM
4 of 5 series · 25 of 40 positions shown · non-contrast
Comparison: None.

CLINICAL DATA: Left shoulder pain for 6 months. No known injury.

EXAM:
MRI OF THE LEFT SHOULDER WITHOUT CONTRAST
TECHNIQUE: Multiplanar, multisequence MR imaging of the shoulder was performed.
No intravenous contrast was administered.

[Series 3: T2 fat-sat · axial · 4.0mm · 0.29mm/px · z∈[-26,+75]mm · 8 of 22 slices shown (1 of 3)]
[im 1/22]
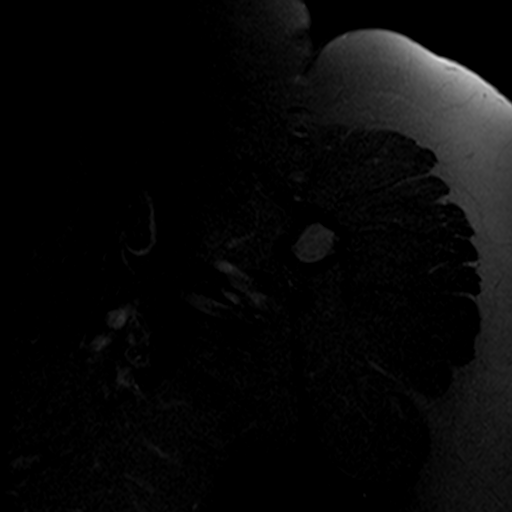
[im 4/22]
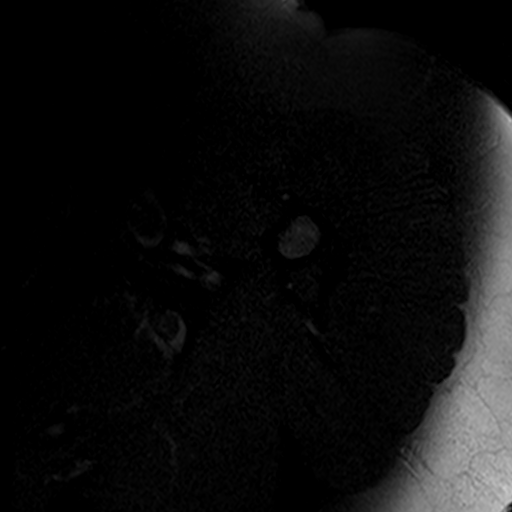
[im 7/22]
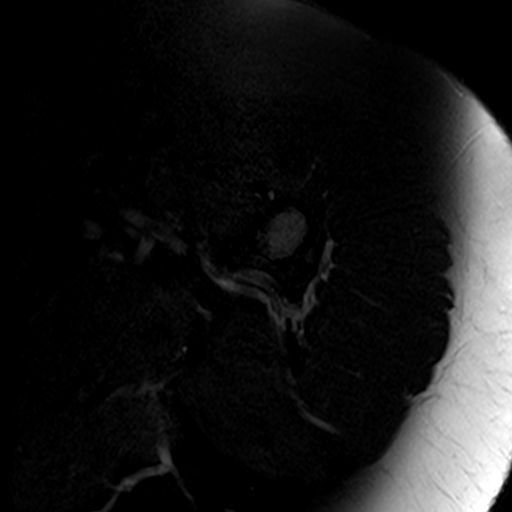
[im 10/22]
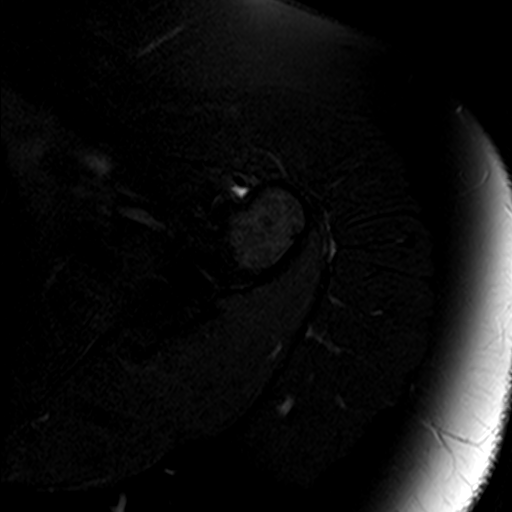
[im 13/22]
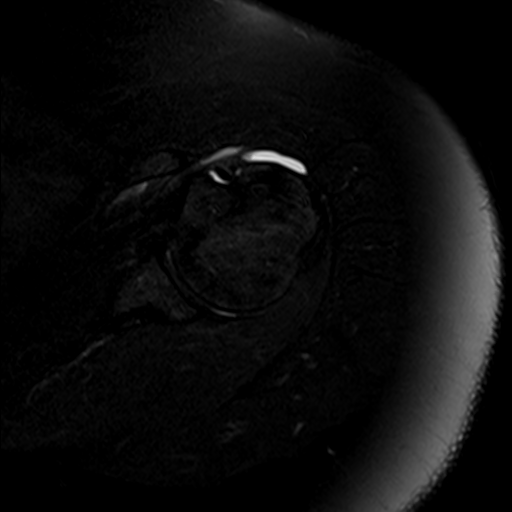
[im 16/22]
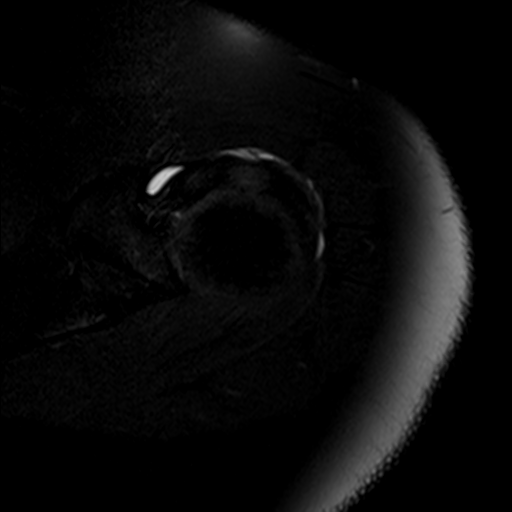
[im 19/22]
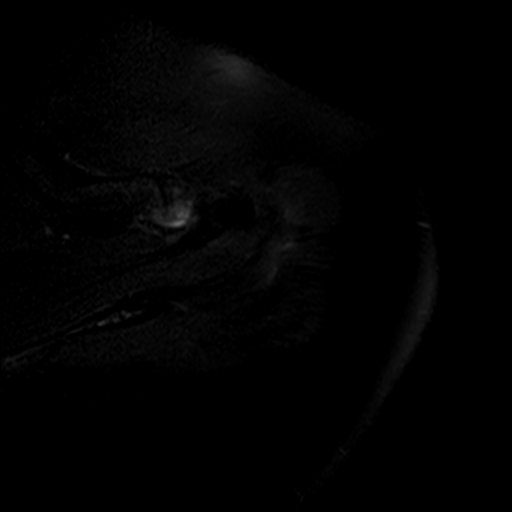
[im 22/22]
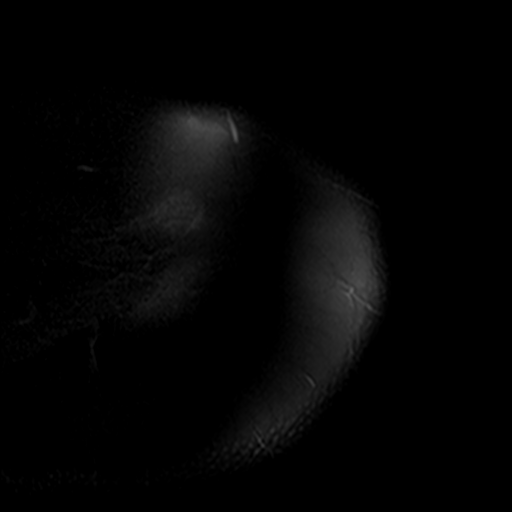

[Series 4: T2 fat-sat · oblique · 4.0mm · 0.29mm/px · 6 of 20 slices shown (2 of 3)]
[im 1/20]
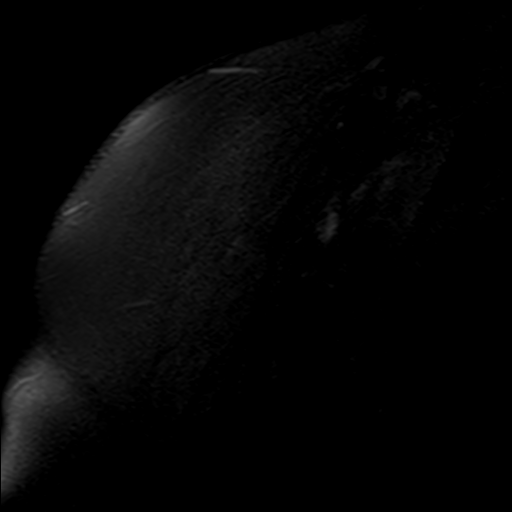
[im 3/20]
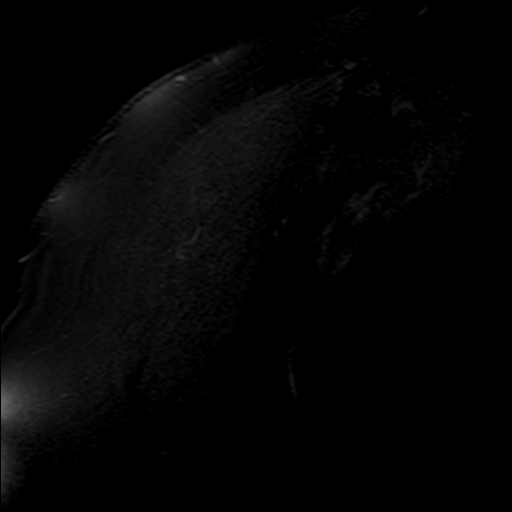
[im 6/20]
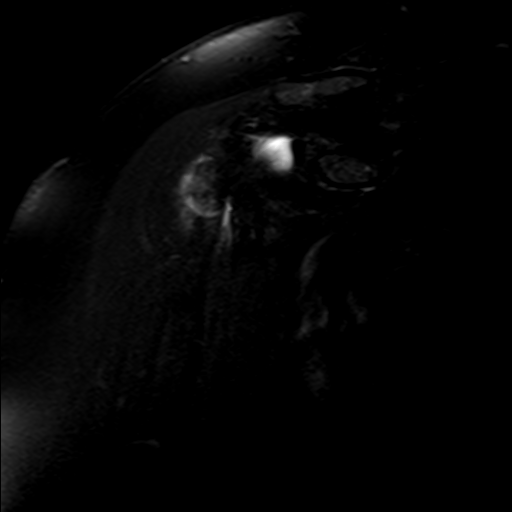
[im 9/20]
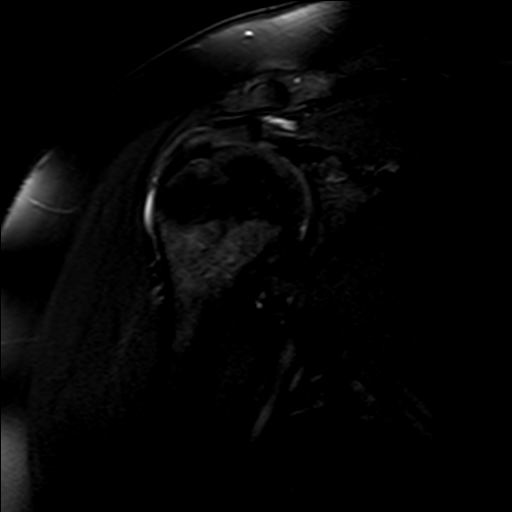
[im 11/20]
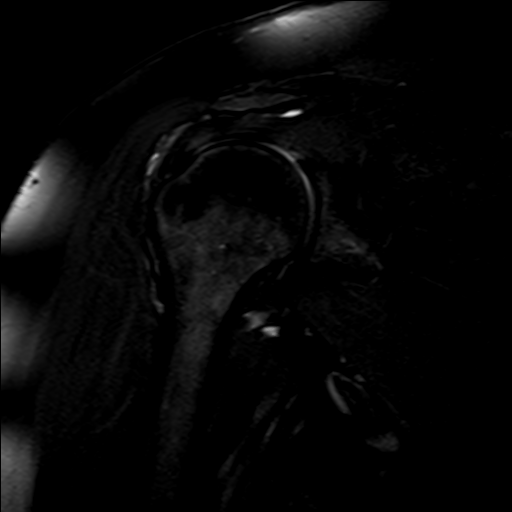
[im 17/20]
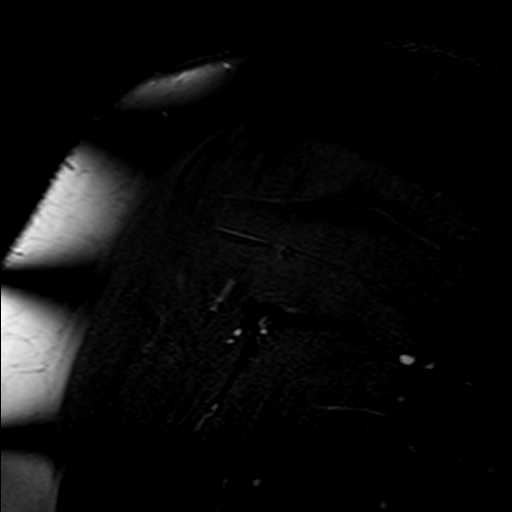

[Series 5: PD · oblique · 4.0mm · 0.59mm/px · 8 of 20 slices shown]
[im 1/20]
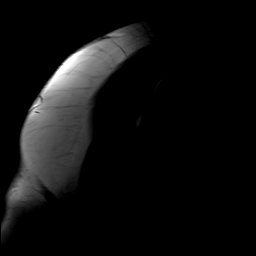
[im 3/20]
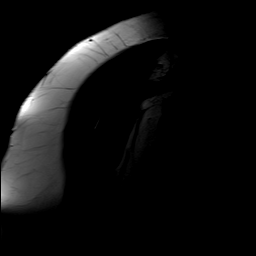
[im 6/20]
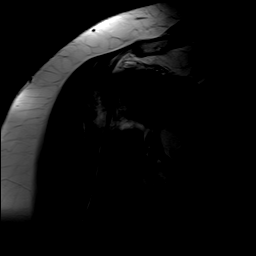
[im 9/20]
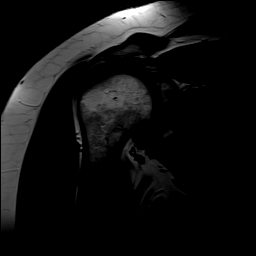
[im 11/20]
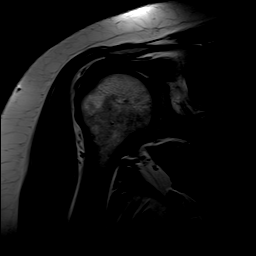
[im 14/20]
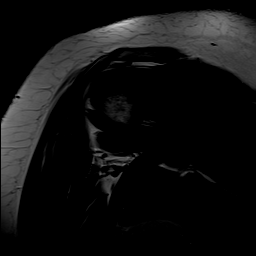
[im 17/20]
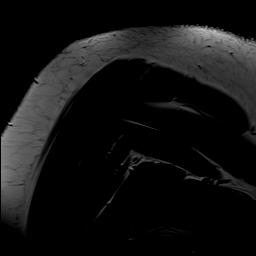
[im 20/20]
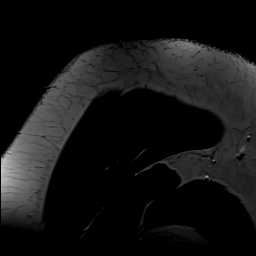

[Series 7: T2 fat-sat · oblique · 4.0mm · 0.29mm/px · 3 of 20 slices shown (3 of 3)]
[im 3/20]
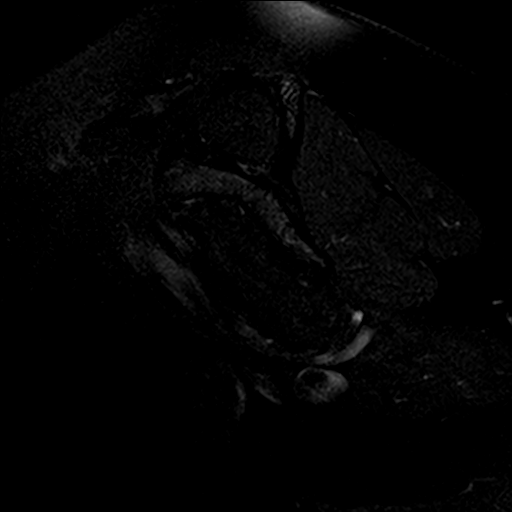
[im 11/20]
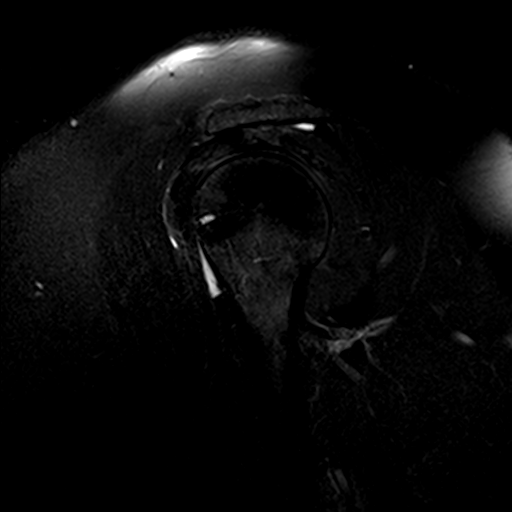
[im 17/20]
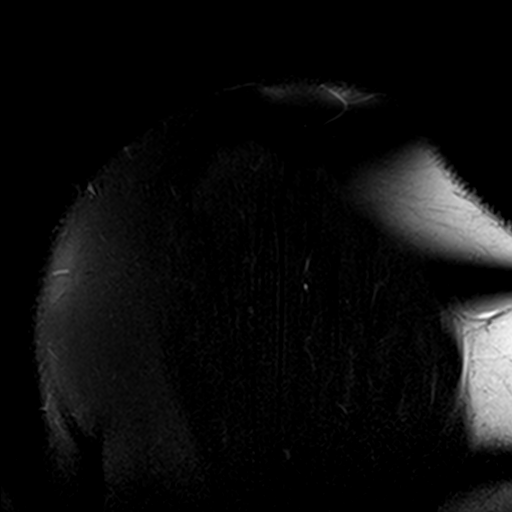

[25 of 40 positions shown; findings below may reference images not displayed]

FINDINGS: Rotator cuff: Moderate tendinosis of the supraspinatus tendon with
fraying along the articular surface. Mild tendinosis of the
infraspinatus tendon. Teres minor tendon is intact. Subscapularis
tendon is intact.

Muscles: No muscle atrophy or edema. No intramuscular fluid
collection or hematoma.

Biceps Long Head: Intraarticular and extraarticular portions of the
biceps tendon are intact.

Acromioclavicular Joint: Mild arthropathy of the acromioclavicular
joint. Type I acromion. Small amount of subacromial/subdeltoid
bursal fluid.

Glenohumeral Joint: No joint effusion. No chondral defect.

Labrum: Grossly intact, but evaluation is limited by lack of
intraarticular fluid/contrast.

Bones: No fracture or dislocation. No aggressive osseous lesion.

Other: No fluid collection or hematoma.
IMPRESSION: 1. Moderate tendinosis of the supraspinatus tendon with fraying
along the articular surface.
2. Mild tendinosis of the infraspinatus tendon.
3. Mild subacromial/subdeltoid bursitis.

## 2020-11-23 ENCOUNTER — Ambulatory Visit: Payer: BC Managed Care – PPO | Admitting: Sports Medicine

## 2020-11-23 ENCOUNTER — Other Ambulatory Visit: Payer: Self-pay

## 2020-11-23 ENCOUNTER — Ambulatory Visit (INDEPENDENT_AMBULATORY_CARE_PROVIDER_SITE_OTHER): Payer: BC Managed Care – PPO

## 2020-11-23 DIAGNOSIS — M25512 Pain in left shoulder: Secondary | ICD-10-CM

## 2020-11-23 DIAGNOSIS — G8929 Other chronic pain: Secondary | ICD-10-CM | POA: Diagnosis not present

## 2020-11-23 DIAGNOSIS — M5030 Other cervical disc degeneration, unspecified cervical region: Secondary | ICD-10-CM

## 2020-11-23 NOTE — Assessment & Plan Note (Signed)
Also with axial neck pain and DDD noted on x-rays. We will start conservatively, she will continue her Lyrica, she tells me she is up tapering her dose but still is not noticing even any sedation which makes me think her dose is still too low, adding cervical spondylosis rehab exercises, I will see her back in a month for the shoulder follow-up at which point we will probably need to be aggressive with up tapering her Lyrica dose.

## 2020-11-23 NOTE — Assessment & Plan Note (Signed)
Done is a very pleasant 50 year old female with a long history of left shoulder pain localized over the deltoid, worse with overhead activities and waking her from sleep. Ultimately she got an MRI that showed rotator cuff tendinosis and fraying, subacromial bursitis but no large full-thickness retracted tears. On exam she has positive Neer's, Hawkins, empty can signs, minimal labral signs, minimal bicipital signs. Today we did a subacromial injection, she will continue her home rehabilitation. Return to see me in 4 to 6 weeks.

## 2020-11-23 NOTE — Progress Notes (Signed)
    Procedures performed today:    Procedure: Real-time Ultrasound Guided injection of the left subacromial bursa Device: Samsung HS60  Verbal informed consent obtained.  Time-out conducted.  Noted no overlying erythema, induration, or other signs of local infection.  Skin prepped in a sterile fashion.  Local anesthesia: Topical Ethyl chloride.  With sterile technique and under real time ultrasound guidance: Noted intact rotator cuff, 1 cc Kenalog 40, 1 cc lidocaine, 1 cc bupivacaine injected easily Completed without difficulty  Advised to call if fevers/chills, erythema, induration, drainage, or persistent bleeding.  Images permanently stored and available for review in PACS.  Impression: Technically successful ultrasound guided injection.  Independent interpretation of notes and tests performed by another provider:   None.  Brief History, Exam, Impression, and Recommendations:    Chronic left shoulder pain Breanna [name] is a very pleasant 50 year old female with a long history of left shoulder pain localized over the deltoid, worse with overhead activities and waking her from sleep. Ultimately she got an MRI that showed rotator cuff tendinosis and fraying, subacromial bursitis but no large full-thickness retracted tears. On exam she has positive Neer's, Hawkins, empty can signs, minimal labral signs, minimal bicipital signs. Today we did a subacromial injection, she will continue her home rehabilitation. Return to see me in 4 to 6 weeks.  DDD (degenerative disc disease), cervical Also with axial neck pain and DDD noted on x-rays. We will start conservatively, she will continue her Lyrica, she tells me she is up tapering her dose but still is not noticing even any sedation which makes me think her dose is still too low, adding cervical spondylosis rehab exercises, I will see her back in a month for the shoulder follow-up at which point we will probably need to be aggressive with up tapering  her Lyrica dose.    ___________________________________________ Ihor Austin. Benjamin Stain, M.D., ABFM., CAQSM. Primary Care and Sports Medicine [facility]  Adjunct Instructor of Family Medicine  [employer]

## 2020-12-21 ENCOUNTER — Ambulatory Visit (INDEPENDENT_AMBULATORY_CARE_PROVIDER_SITE_OTHER): Payer: BC Managed Care – PPO

## 2020-12-21 ENCOUNTER — Other Ambulatory Visit: Payer: Self-pay

## 2020-12-21 ENCOUNTER — Ambulatory Visit: Payer: BC Managed Care – PPO | Admitting: Sports Medicine

## 2020-12-21 DIAGNOSIS — M5030 Other cervical disc degeneration, unspecified cervical region: Secondary | ICD-10-CM

## 2020-12-21 DIAGNOSIS — M1711 Unilateral primary osteoarthritis, right knee: Secondary | ICD-10-CM | POA: Diagnosis not present

## 2020-12-21 DIAGNOSIS — Z09 Encounter for follow-up examination after completed treatment for conditions other than malignant neoplasm: Secondary | ICD-10-CM

## 2020-12-21 DIAGNOSIS — M25512 Pain in left shoulder: Secondary | ICD-10-CM

## 2020-12-21 DIAGNOSIS — G5761 Lesion of plantar nerve, right lower limb: Secondary | ICD-10-CM | POA: Diagnosis not present

## 2020-12-21 DIAGNOSIS — M25561 Pain in right knee: Secondary | ICD-10-CM | POA: Diagnosis not present

## 2020-12-21 DIAGNOSIS — M7731 Calcaneal spur, right foot: Secondary | ICD-10-CM | POA: Diagnosis not present

## 2020-12-21 DIAGNOSIS — G8929 Other chronic pain: Secondary | ICD-10-CM

## 2020-12-21 IMAGING — DX DG KNEE 1-2V*L*
2 series · 2 of 2 positions shown · non-contrast
Comparison: None.

CLINICAL DATA: Right knee pain

EXAM:
LEFT KNEE - 1-2 VIEW

[knee tunnel]
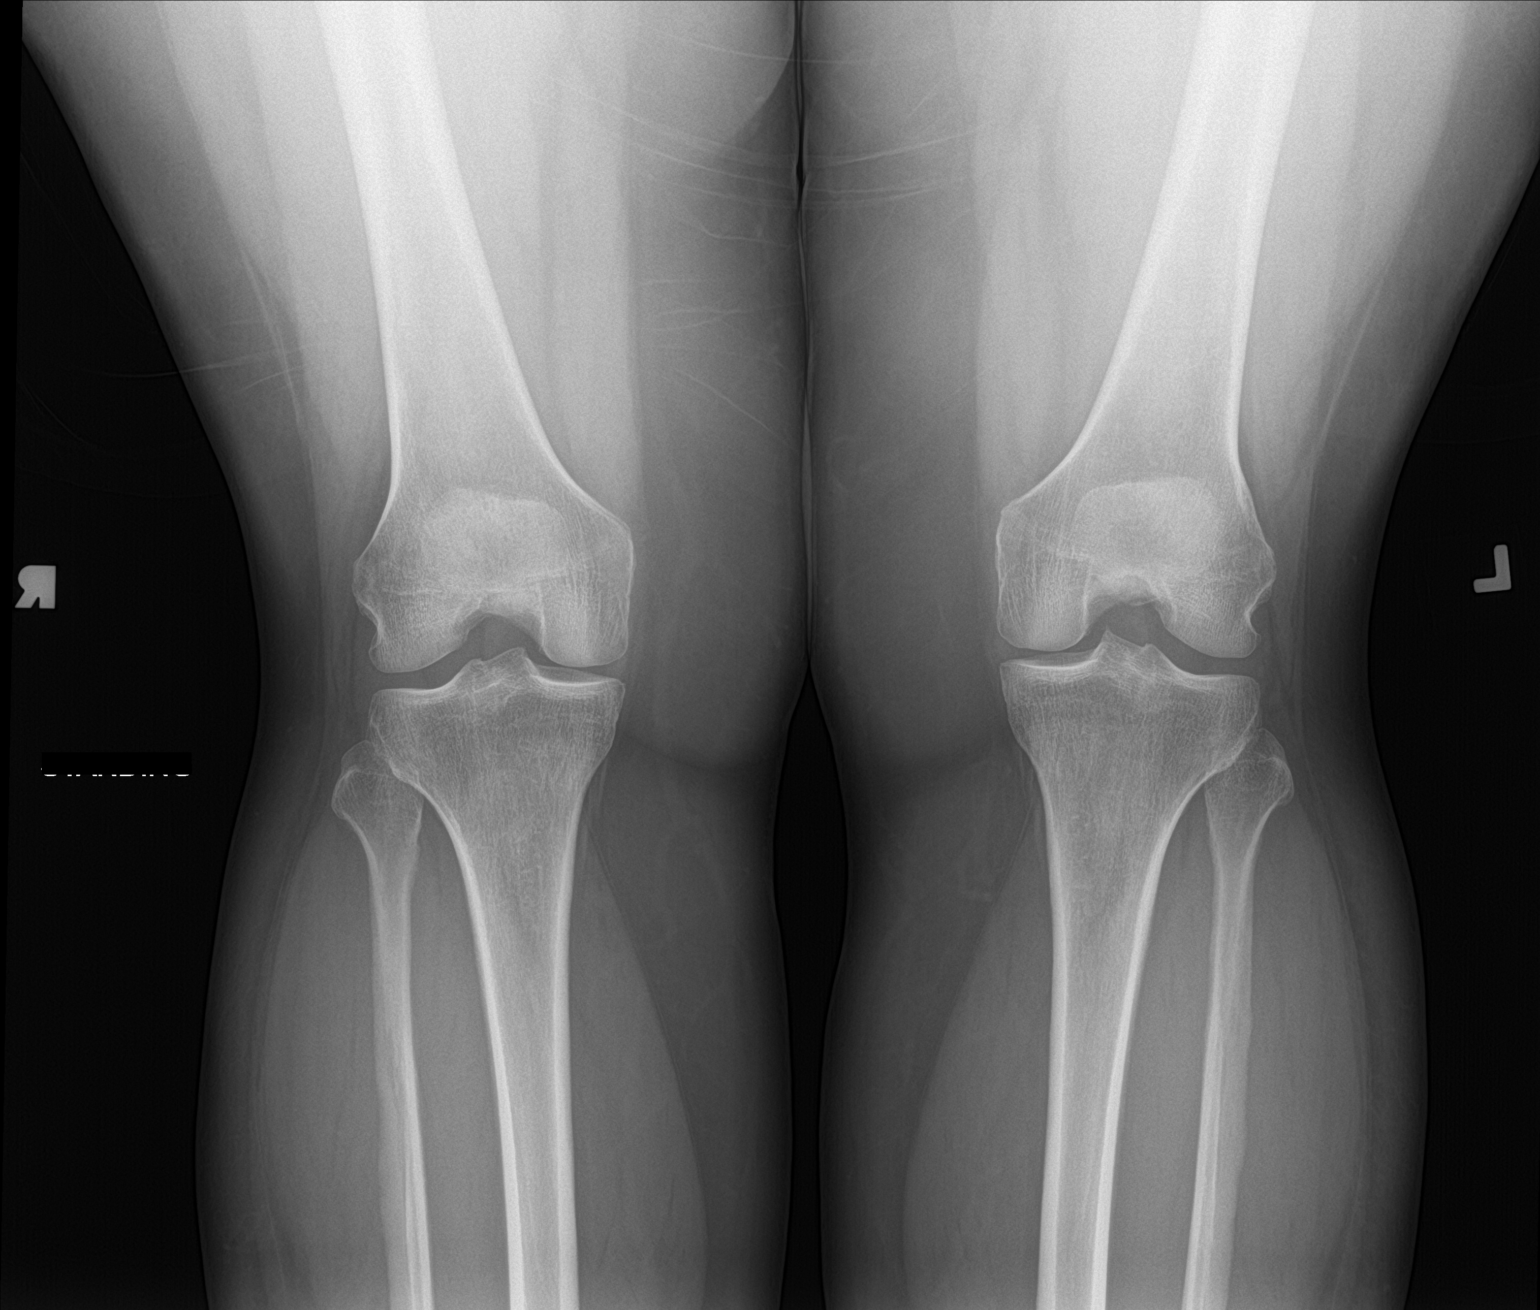

[knee ap bilat standing]
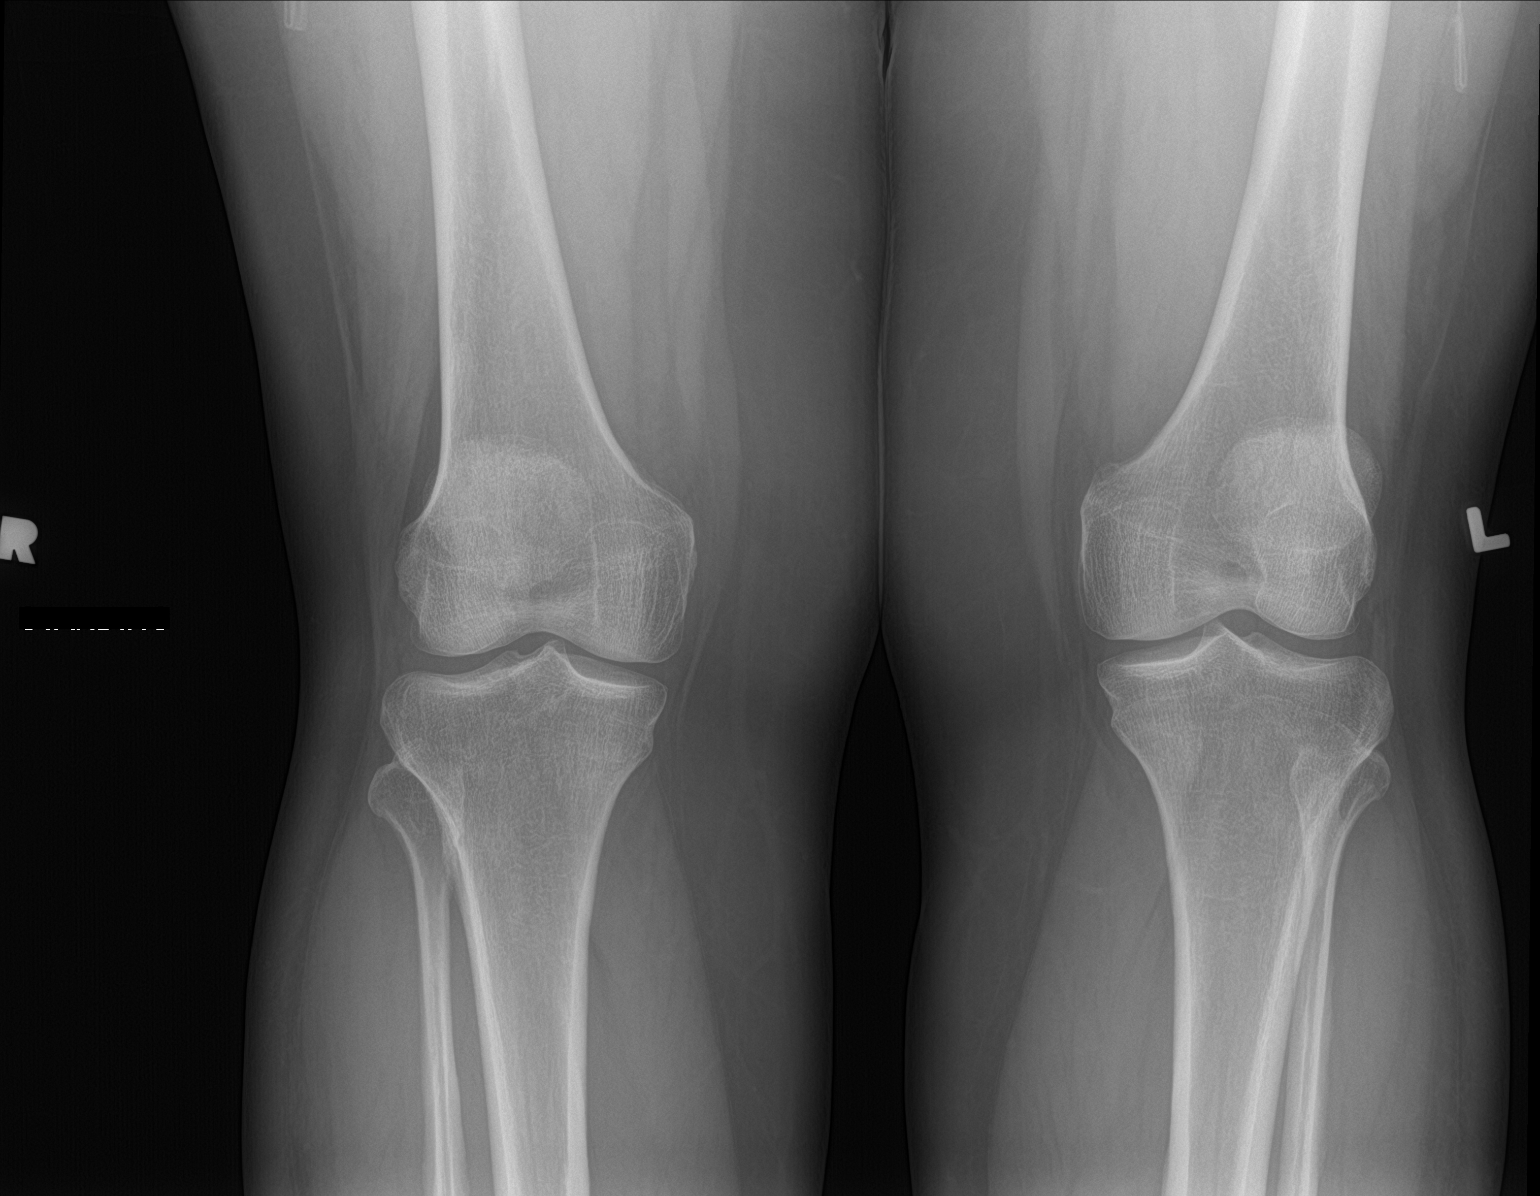

[2 of 2 positions shown; findings below may reference images not displayed]

FINDINGS: Two views of the left knee were provided for comparison purposes.
Bilateral AP and KLECIA views of the left knee demonstrate normal
alignment. No definite fracture or dislocation. Medial and lateral
compartment joint spaces are preserved.
IMPRESSION: Negative limited examination of the left knee.  And

## 2020-12-21 IMAGING — DX DG FOOT COMPLETE 3+V*R*
3 series · 3 of 3 positions shown · non-contrast
Comparison: None.

CLINICAL DATA: Right foot pain, Morton's neuroma

EXAM:
RIGHT FOOT COMPLETE - 3+ VIEW

[foot ap]
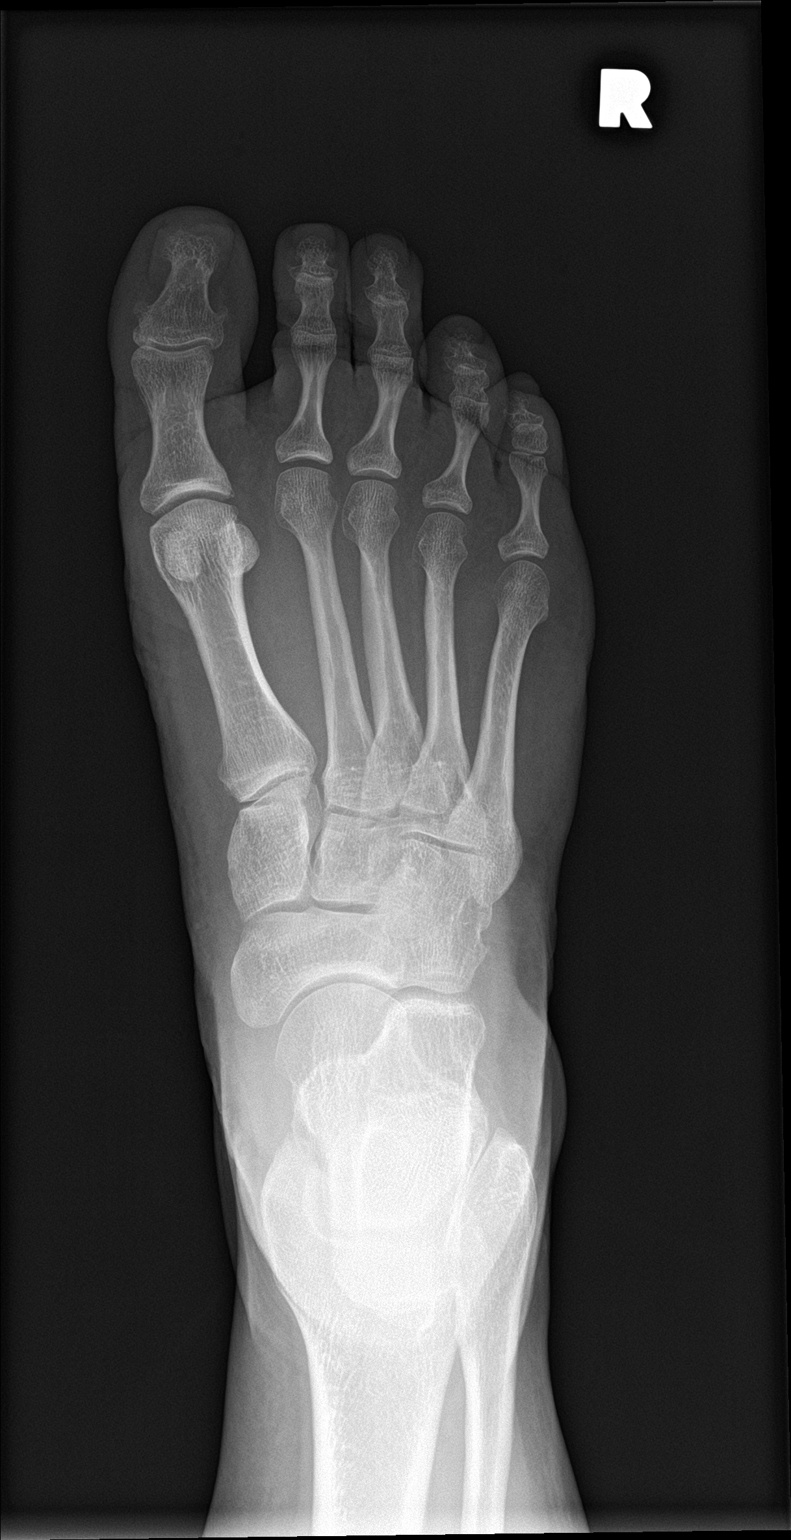

[foot obl]
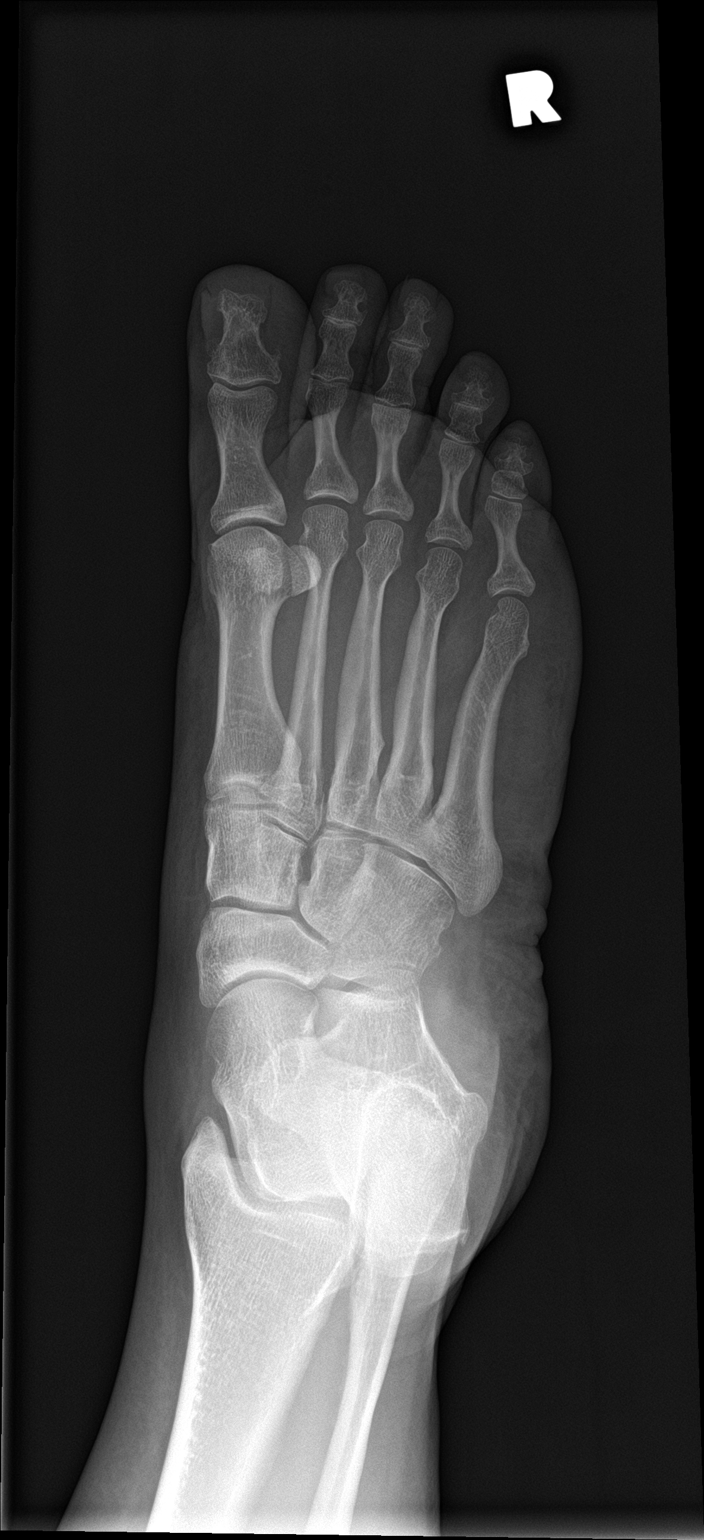

[foot lat]
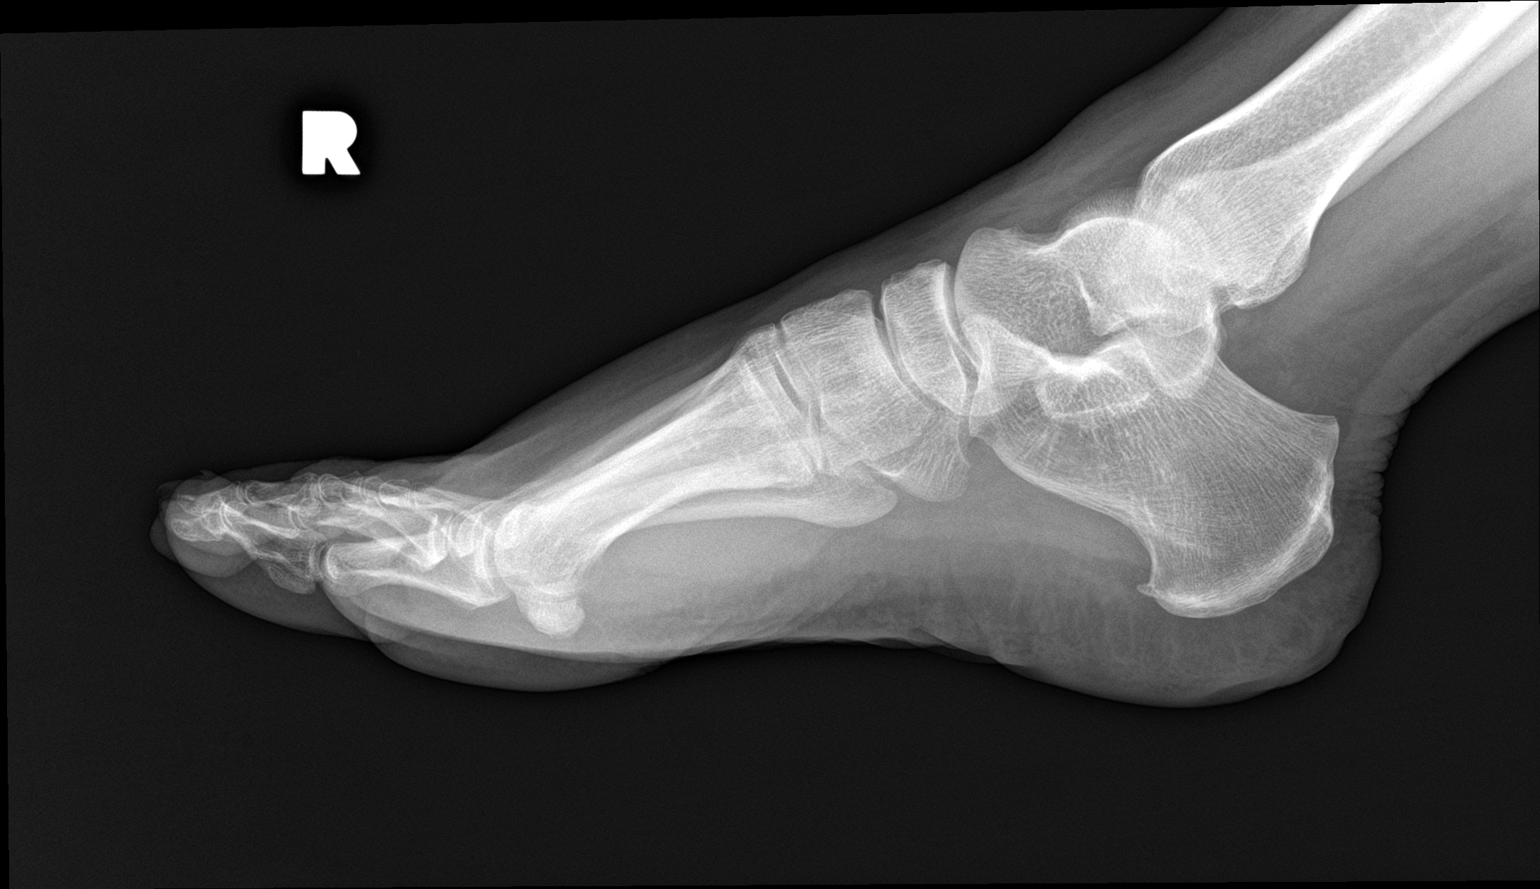

[3 of 3 positions shown; findings below may reference images not displayed]

FINDINGS: There is no evidence of fracture or dislocation. Tiny plantar
calcaneal spur. There is no evidence of arthropathy or other focal
bone abnormality. Soft tissues are unremarkable.
IMPRESSION: Negative.

## 2020-12-21 IMAGING — DX DG KNEE COMPLETE 4+V*R*
4 series · 4 of 4 positions shown · non-contrast
Comparison: None.

CLINICAL DATA: Right knee pain

EXAM:
RIGHT KNEE - COMPLETE 4+ VIEW

[knee tunnel]
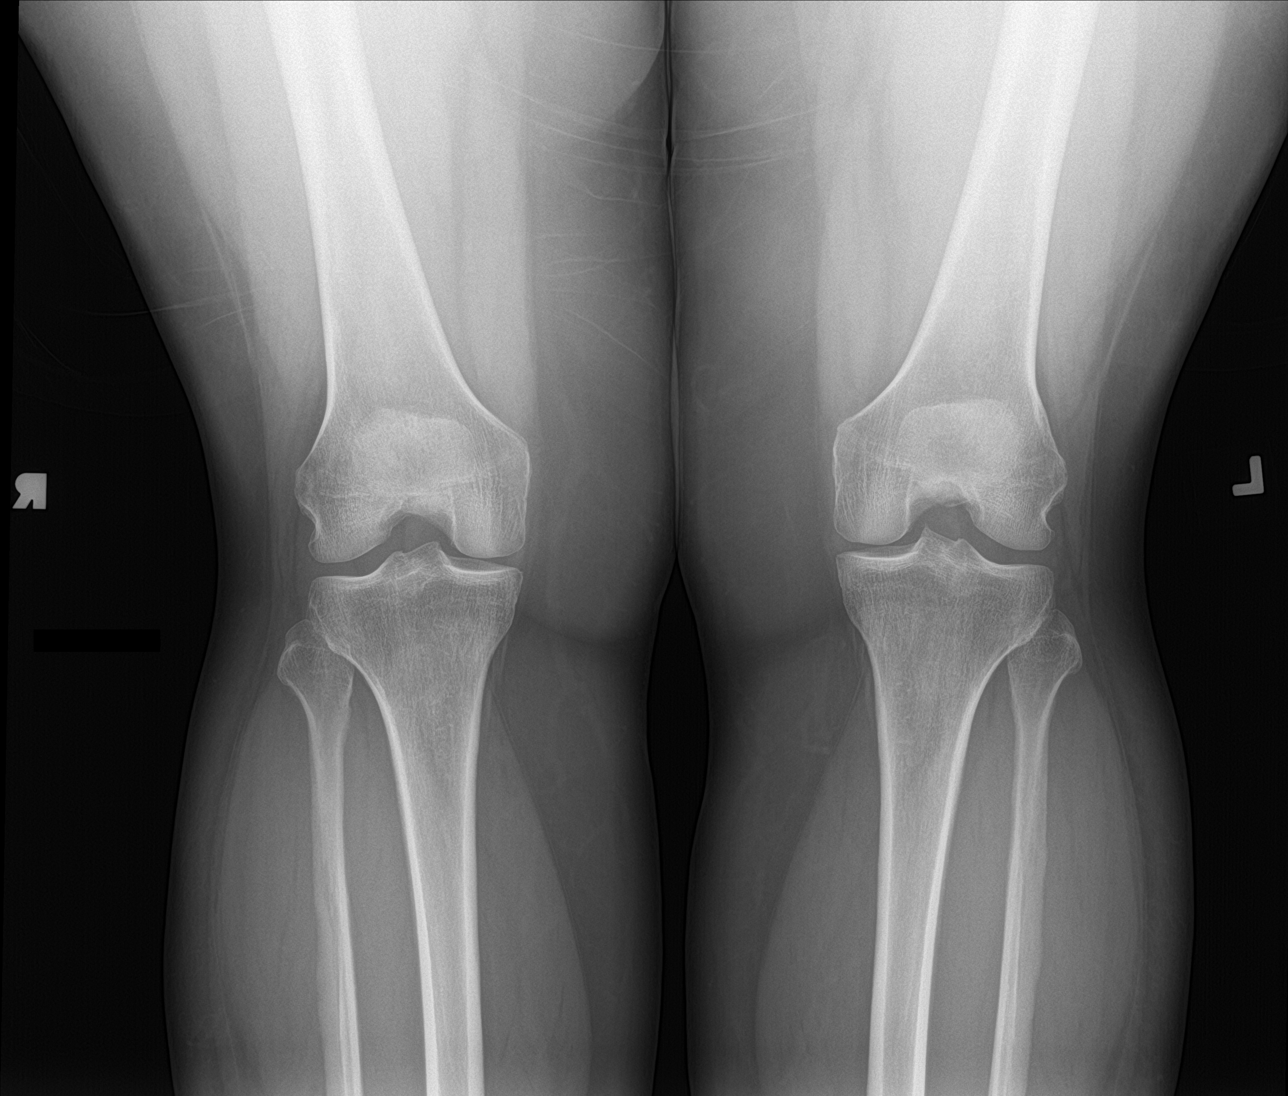

[knee lat]
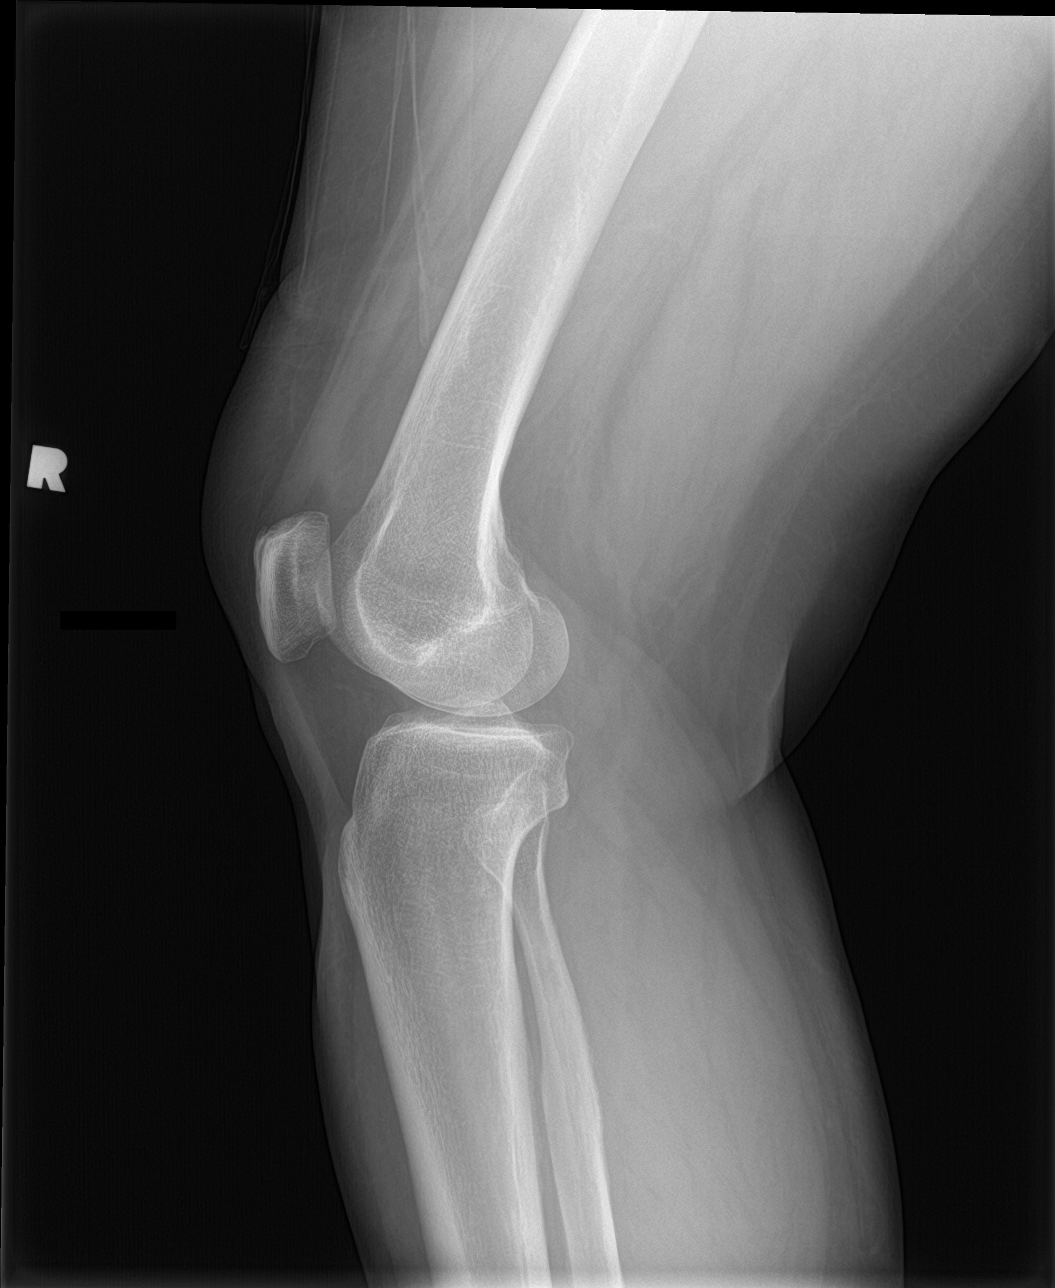

[knee sunrise]
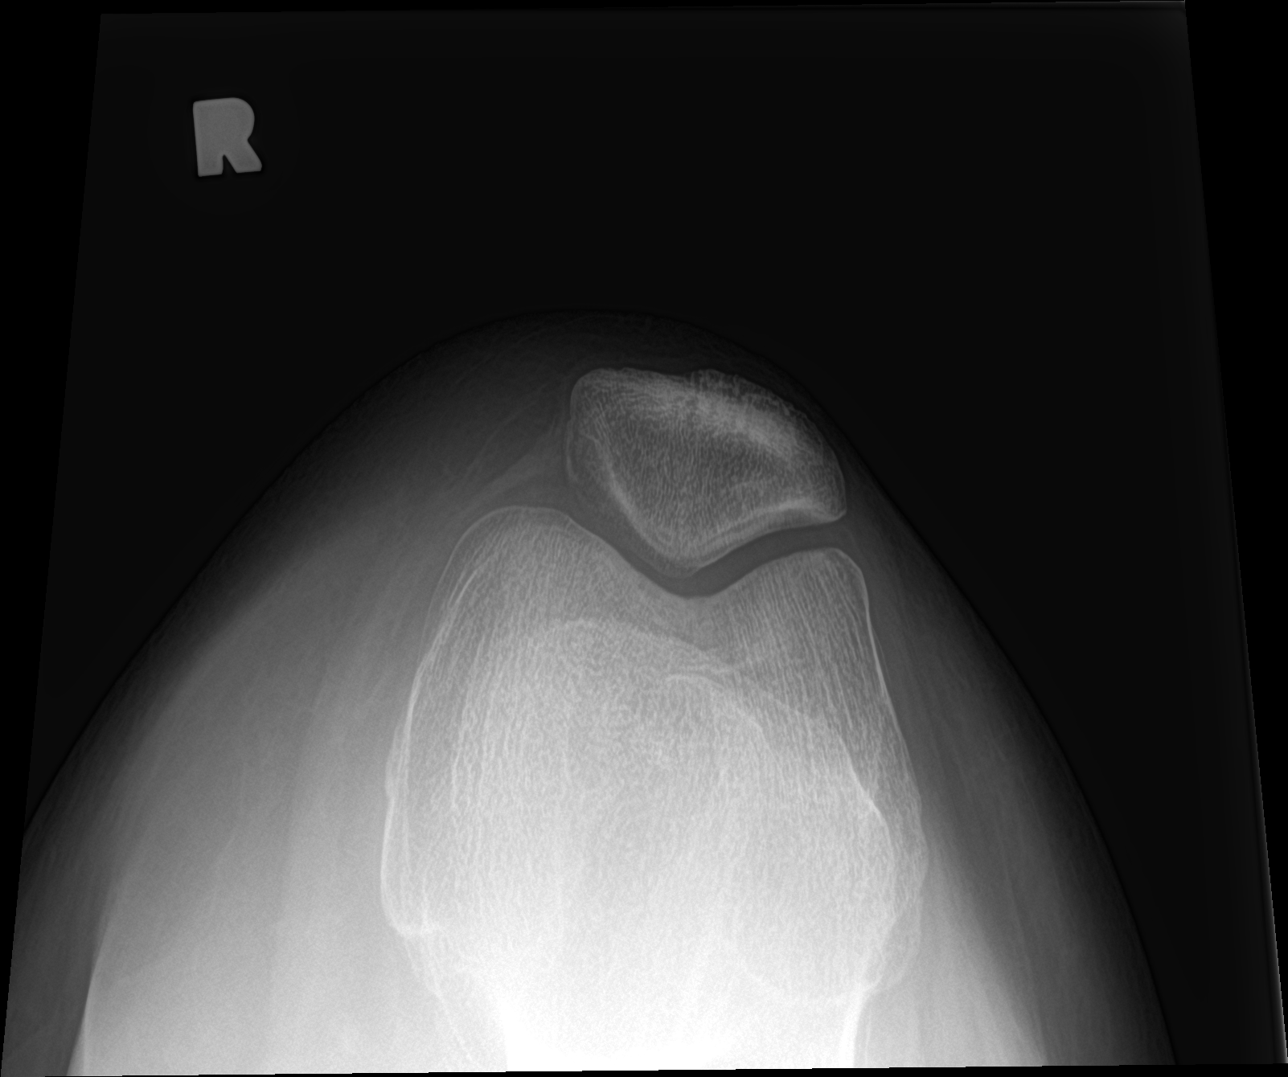

[knee ap bilat standing]
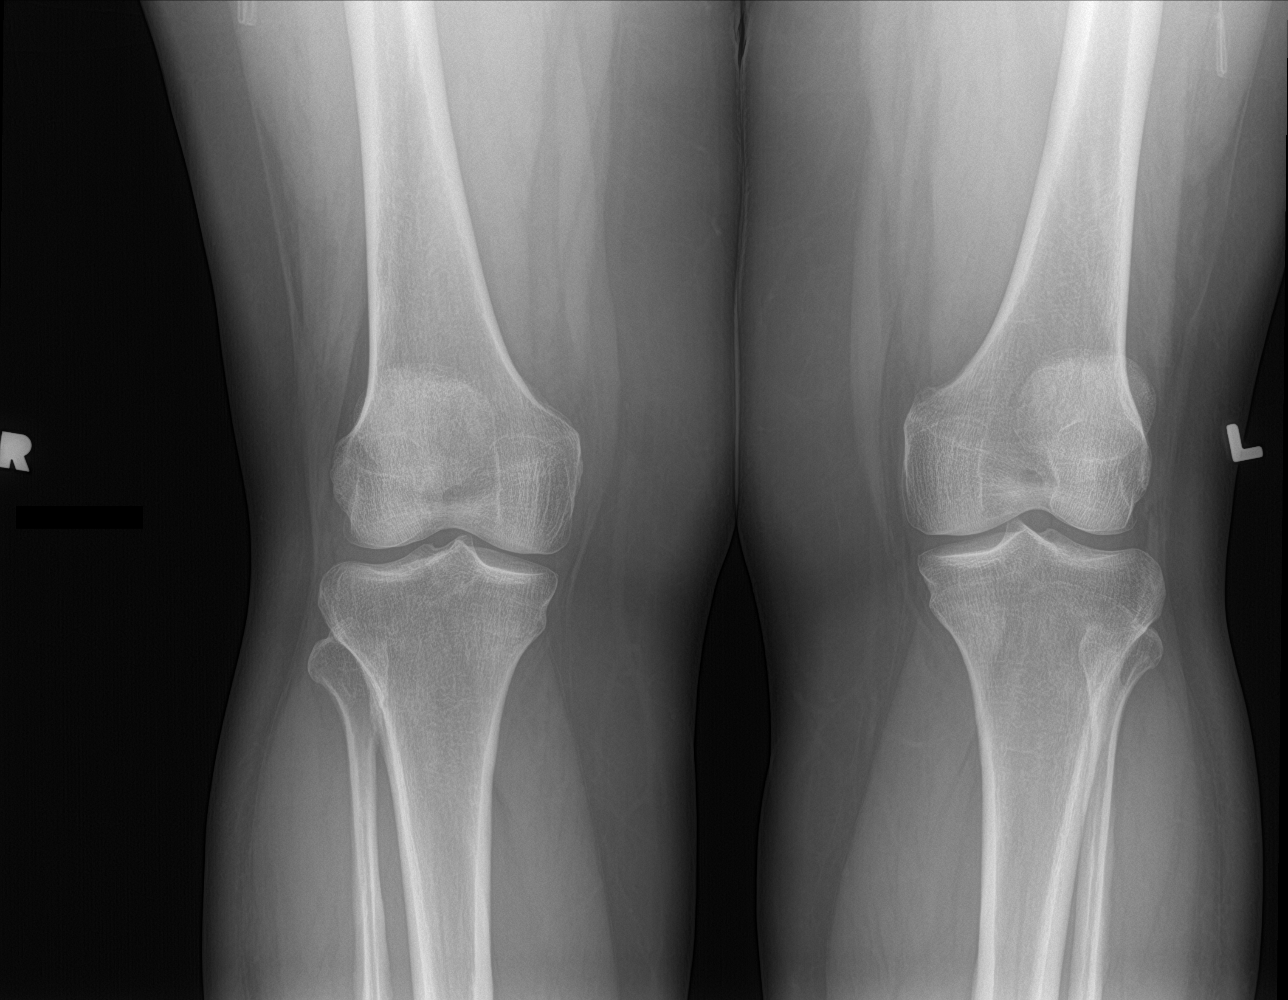

[4 of 4 positions shown; findings below may reference images not displayed]

FINDINGS: No evidence of fracture, dislocation, or joint effusion. No evidence
of arthropathy or other focal bone abnormality. Soft tissues are
unremarkable.
IMPRESSION: Negative.

## 2020-12-21 MED ORDER — DICLOFENAC SODIUM 2 % EX SOLN: 2.0000 | Freq: Two times a day (BID) | CUTANEOUS | 0 refills | Status: DC

## 2020-12-21 MED ORDER — PREGABALIN 100 MG PO CAPS: 100.0000 mg | ORAL_CAPSULE | Freq: Two times a day (BID) | ORAL | 3 refills | Status: DC

## 2020-12-21 NOTE — Assessment & Plan Note (Signed)
Breanna [name] returns, she is a pleasant 50 year old female, she had an MRI that showed cuff tendinosis, impingement signs and symptoms at the last visit, we injected her subacromial bursa and she returns today doing better.

## 2020-12-21 NOTE — Assessment & Plan Note (Signed)
Posterior and medial joint line pain, no mechanical symptoms. Celebrex ineffective. Adding x-rays, knee rehab exercises, custom orthotics, topical Pennsaid (diclofenac 2% topical). Return to see me in a month, injections if no better.

## 2020-12-21 NOTE — Progress Notes (Signed)
    Procedures performed today:    None.  Independent interpretation of notes and tests performed by another provider:   None.  Brief History, Exam, Impression, and Recommendations:    Chronic left shoulder pain Breanna [name] returns, she is a pleasant 50 year old female, she had an MRI that showed cuff tendinosis, impingement signs and symptoms at the last visit, we injected her subacromial bursa and she returns today doing better.  DDD (degenerative disc disease), cervical Breanna [name] continues to have neck pain, right-sided and axial with radiation of the right pericranium, Lyrica currently at 100 total milligrams is ineffective. She has done physician directed conservative treatment, since at least April. At this juncture we will proceed with MRI for epidural planning and go up on her Lyrica dose, return to see me to go over MRI results and we will plan her epidural at that juncture.  Primary osteoarthritis of right knee Posterior and medial joint line pain, no mechanical symptoms. Celebrex ineffective. Adding x-rays, knee rehab exercises, custom orthotics, topical Pennsaid (diclofenac 2% topical). Return to see me in a month, injections if no better.  Morton's neuroma, right Breanna [name] has also been complaining of numbness on the lateral aspect of her right second toe, on exam she does have some metatarsalgia and reproduction of numbness with compression of the metatarsal heads raising the suspicion for a Morton's neuroma. Referral to Dr. Jordan Likes for custom orthotics with a metatarsal pad. I would also like some x-rays.    ___________________________________________ Ihor Austin. Benjamin Stain, M.D., ABFM., CAQSM. Primary Care and Sports Medicine [facility]  Adjunct Instructor of Family Medicine  [employer]

## 2020-12-21 NOTE — Assessment & Plan Note (Signed)
Breanna [name] has also been complaining of numbness on the lateral aspect of her right second toe, on exam she does have some metatarsalgia and reproduction of numbness with compression of the metatarsal heads raising the suspicion for a Morton's neuroma. Referral to Dr. Jordan Likes for custom orthotics with a metatarsal pad. I would also like some x-rays.

## 2020-12-21 NOTE — Assessment & Plan Note (Signed)
Breanna [name] continues to have neck pain, right-sided and axial with radiation of the right pericranium, Lyrica currently at 100 total milligrams is ineffective. She has done physician directed conservative treatment, since at least April. At this juncture we will proceed with MRI for epidural planning and go up on her Lyrica dose, return to see me to go over MRI results and we will plan her epidural at that juncture.

## 2020-12-26 ENCOUNTER — Other Ambulatory Visit: Payer: Self-pay

## 2020-12-26 ENCOUNTER — Ambulatory Visit (INDEPENDENT_AMBULATORY_CARE_PROVIDER_SITE_OTHER): Payer: BC Managed Care – PPO

## 2020-12-26 DIAGNOSIS — M5030 Other cervical disc degeneration, unspecified cervical region: Secondary | ICD-10-CM

## 2020-12-26 DIAGNOSIS — M542 Cervicalgia: Secondary | ICD-10-CM | POA: Diagnosis not present

## 2020-12-26 IMAGING — MR MR CERVICAL SPINE W/O CM
5 series · 41 of 48 positions shown · non-contrast
Comparison: Cervical spine radiographs [DATE]

CLINICAL DATA: Cervical degenerative disc disease. Chronic neck
pain and headaches. Cervical radiculopathy. Right-sided neck pain
radiating to the right pericranium.

EXAM:
MRI CERVICAL SPINE WITHOUT CONTRAST
TECHNIQUE: Multiplanar, multisequence MR imaging of the cervical spine was
performed. No intravenous contrast was administered.

[Series 2: T2 · sagittal · 3.0mm · 0.69mm/px · 6 of 13 slices shown (1 of 2)]
[im 1/13]
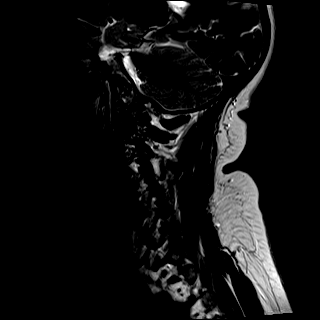
[im 3/13]
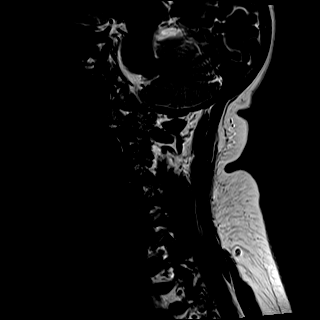
[im 5/13]
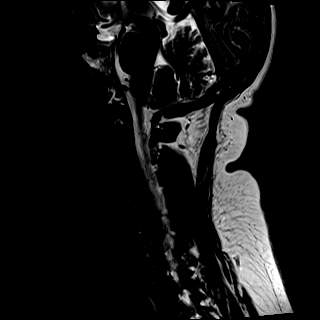
[im 8/13]
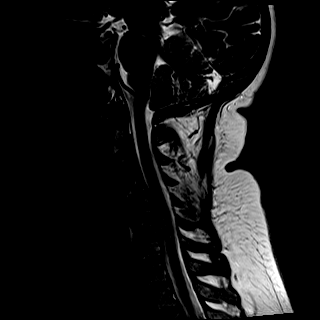
[im 10/13]
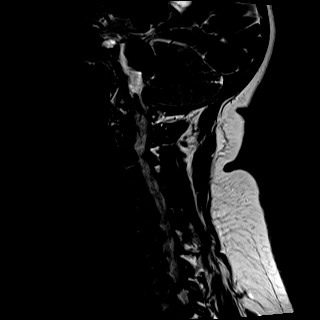
[im 13/13]
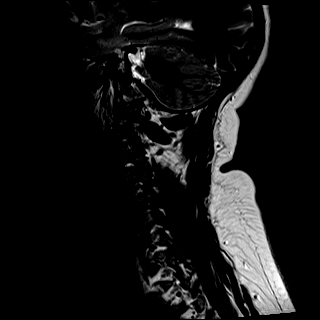

[Series 3: T1 · sagittal · 3.0mm · 0.86mm/px · 7 of 13 slices shown]
[im 1/13]
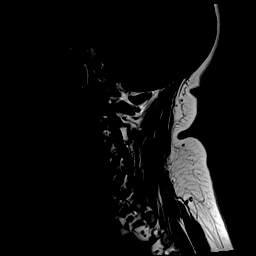
[im 3/13]
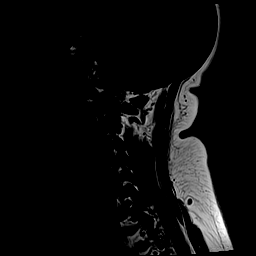
[im 5/13]
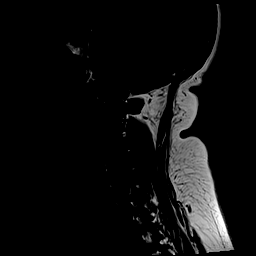
[im 7/13]
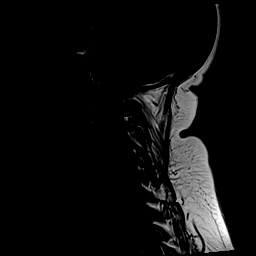
[im 9/13]
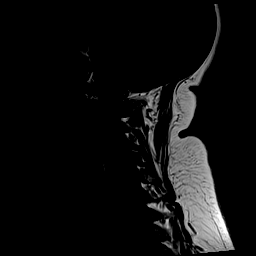
[im 11/13]
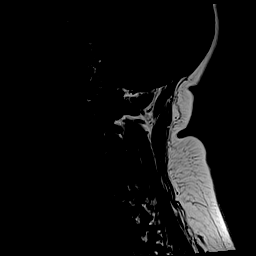
[im 13/13]
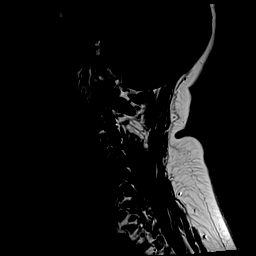

[Series 4: STIR · sagittal · 3.0mm · 0.69mm/px · 7 of 13 slices shown]
[im 1/13]
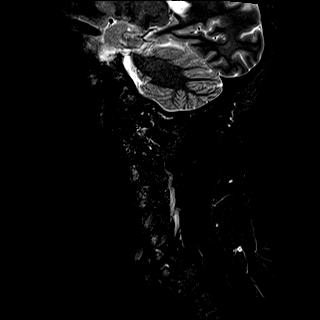
[im 3/13]
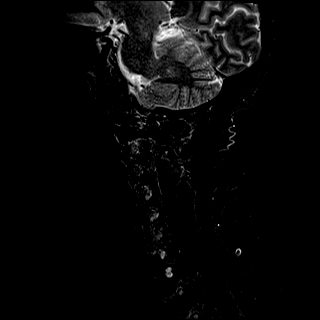
[im 5/13]
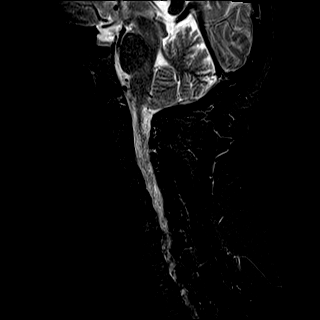
[im 7/13]
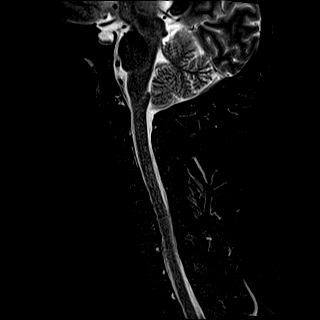
[im 9/13]
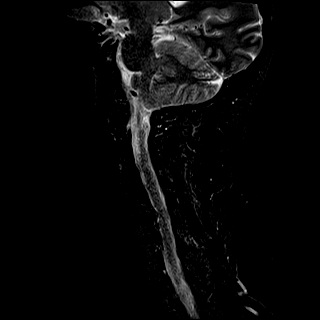
[im 11/13]
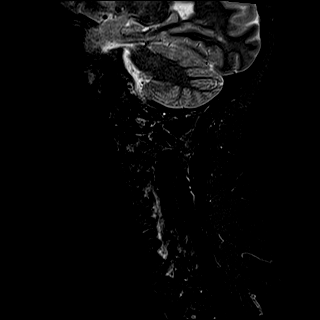
[im 13/13]
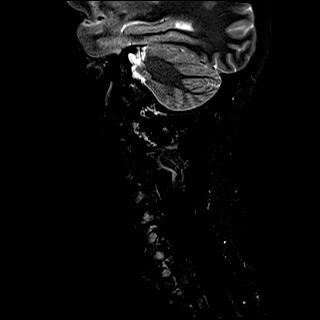

[Series 5: T2 · axial · 3.0mm · 0.62mm/px · z∈[-89,+9]mm · 13 of 27 slices shown (2 of 2)]
[im 1/27]
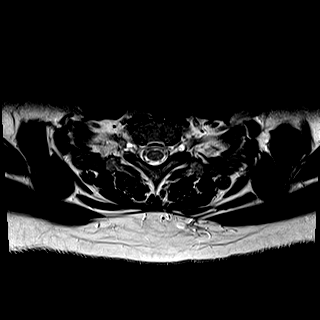
[im 3/27]
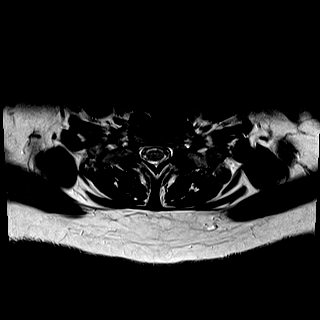
[im 5/27]
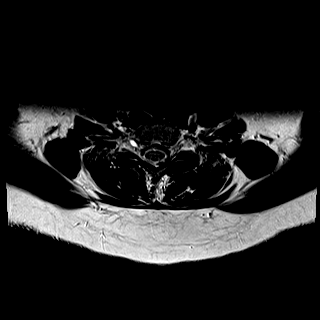
[im 7/27]
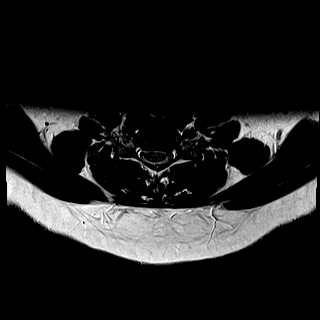
[im 9/27]
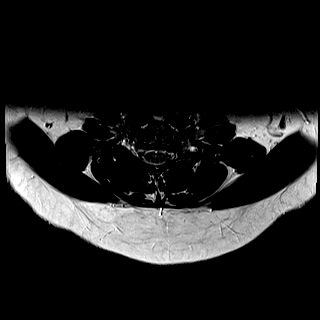
[im 11/27]
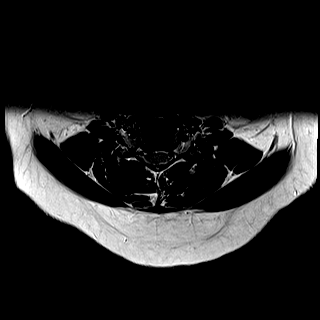
[im 13/27]
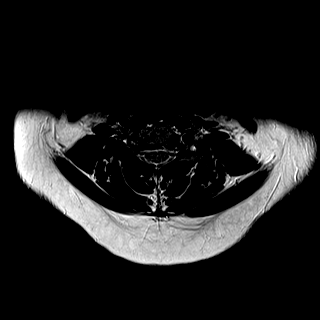
[im 15/27]
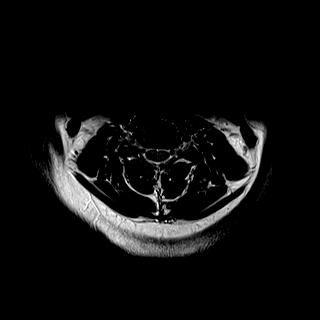
[im 17/27]
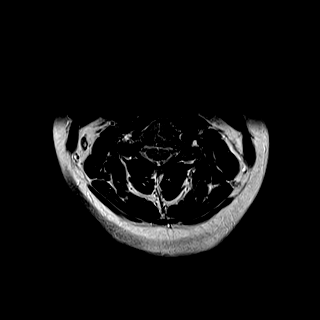
[im 19/27]
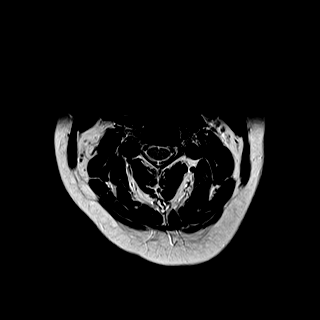
[im 21/27]
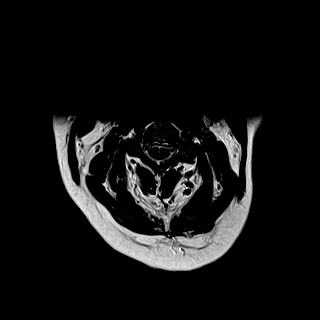
[im 23/27]
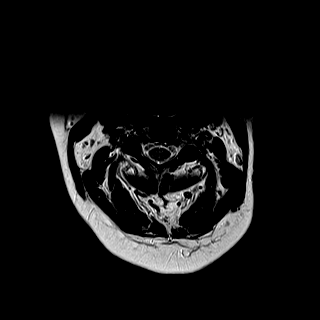
[im 27/27]
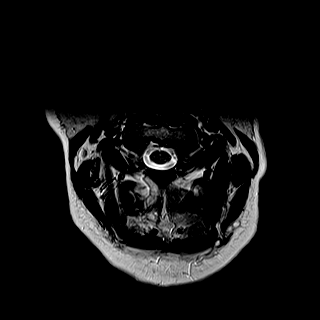

[Series 6: mpgr ax · axial · 3.0mm · 0.35mm/px · z∈[-80,+17]mm · 8 of 27 slices shown]
[im 1/27]
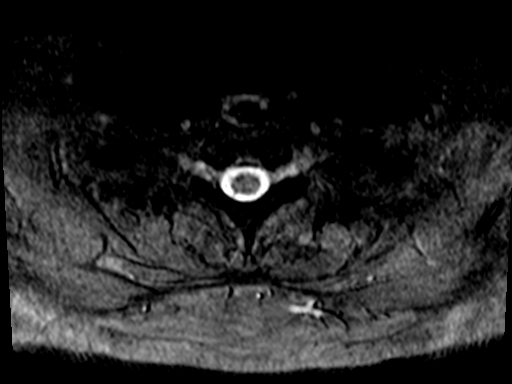
[im 5/27]
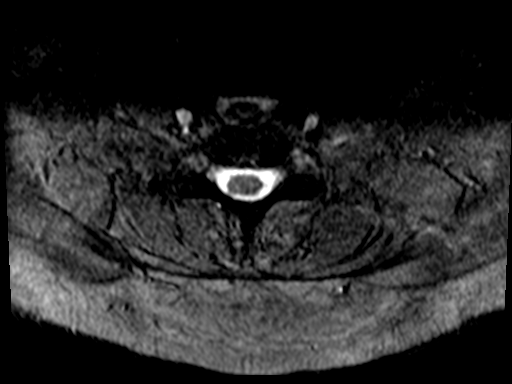
[im 9/27]
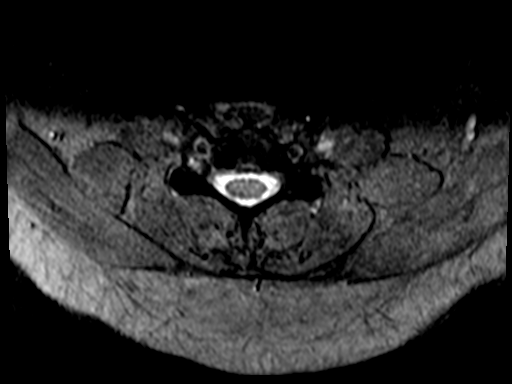
[im 13/27]
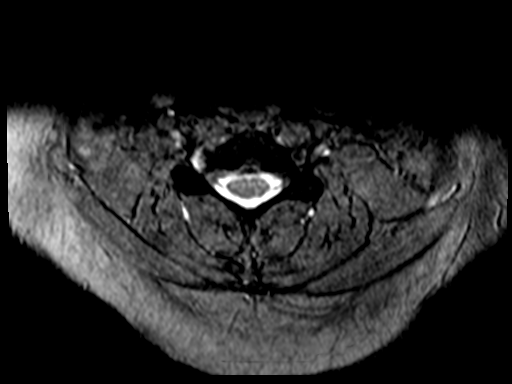
[im 15/27]
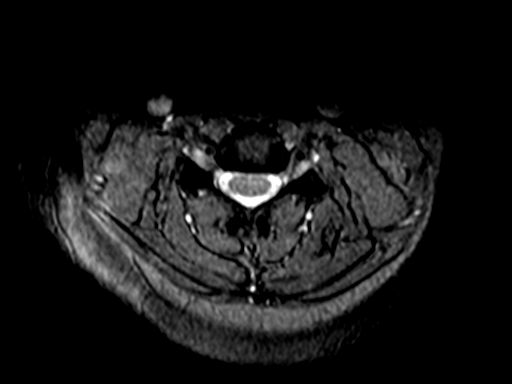
[im 19/27]
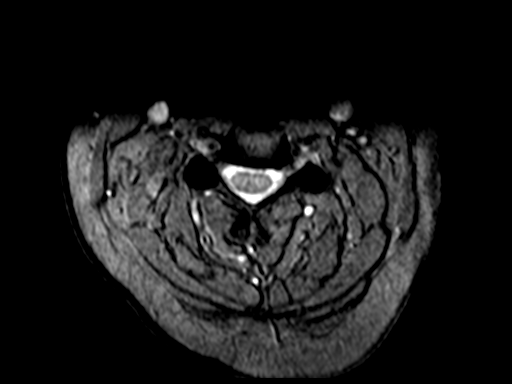
[im 23/27]
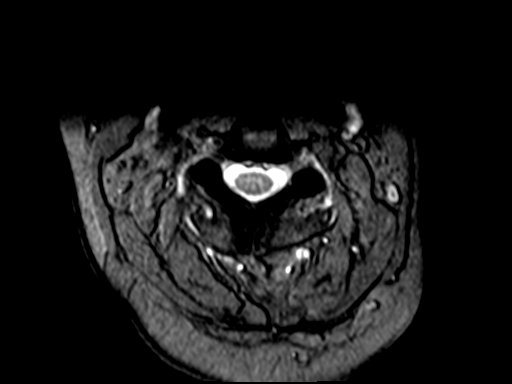
[im 27/27]
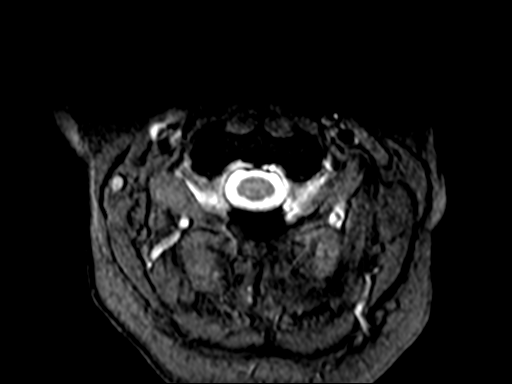

[41 of 48 positions shown; findings below may reference images not displayed]

FINDINGS: Alignment: Slight reversal of the normal cervical lordosis. No
listhesis.

Vertebrae: No fracture, suspicious marrow lesion, or significant
marrow edema.

Cord: Normal signal.

Posterior Fossa, vertebral arteries, paraspinal tissues:
Unremarkable.

Disc levels:

C2-3: Mild left facet arthrosis without disc herniation or stenosis.

C3-4: Negative.

C4-5: Negative.

C5-6: Mild disc bulging and a right paracentral disc protrusion
result in mild right-sided spinal stenosis with slight ventral cord
flattening and borderline to mild medial right neural foraminal
stenosis.

C6-7: Disc bulging and a left foraminal disc protrusion result in
mild spinal stenosis and moderate left neural foraminal stenosis
with potential left C7 nerve root impingement.

C7-T1: Negative.
IMPRESSION: 1. Mild cervical spondylosis with mild spinal stenosis at C5-6 and
C6-7.
2. Moderate left neural foraminal stenosis at C6-7 due to a disc
protrusion.

## 2020-12-27 ENCOUNTER — Other Ambulatory Visit: Payer: Self-pay | Admitting: Medical-Surgical

## 2020-12-27 NOTE — Progress Notes (Signed)
Office Visit Note  Patient: Breanna [name]             Date of Birth: 07-Jan-1971           MRN: [id number]             PCP: Samuel Bouche, NP Referring: Samuel Bouche, NP Visit Date: 12/28/2020 Occupation: Accountant  Subjective:  New Patient (Initial Visit) (Patient complains of neck and low back pain. Patient was having left shoulder pain but after an injection about a month ago she does notice improvement. Hx of Humira, discontinued at least a year ago- patient feels as if Humira was not treating her symptoms well. )   History of Present Illness: Breanna [name] a 50 y.o. female here for axial and peripheral spondyloarthritis. She previously saw a rheumatologist in [place] and took Humira for this but has been off treatment for at least a year. Review of previous rheumatology records indicates most recent treatment was on methotrexate and humira with reasonably good disease control. Apparently GI intolerance of methotrexate with titration to 25 mg PO weekly but tolerated at lower doses and labs monitoring were okay. She previously had joint involvement with pain in the lower thoracic spine, lumbar spine, right SI joint, and bilateral heels. She has previously reported joint pain dating back at least 20 years in duration. She moved from [place] due to work so has been off treatment since that time. She has noticed some increase in joint pain and stiffness but worst symptom increase [name] fatigue. She felt Humira was controlled symptoms well initially with loss of improvement, and addition of methotrexate did not improve much. She currently has neck and back pain mostly, at night and in the morning but also increase in neck pain during the day working. She recently underwent left shoulder injection  a month ago with improvement in pain there. Otherwise she has not taken any steroid medication recently.  Outside labs reviewed HLA-B27 pos  DMARD Hx Humira 2020-? MTX 2020-? NSAIDs - inadequate  response  Imaging reviewed (report only available) 03/30/18 Xray bilateral hands No erosions 03/30/18 Xray cervical spine Mild C5-C6 probable disc degeneration 03/30/18 Xray thoracic spine No significant disc space degeneration. No definitive AS changes. 03/30/18 Xray lumbar spine Straightening of normal lumbar lordosis. No fracture. No significant disc space narrowing. 02/20/18 Xray SI joints No definitive evidence of sacroiliitis  Activities of Daily Living:  Patient reports morning stiffness for 5-10 minutes.   Patient Reports nocturnal pain.  Difficulty dressing/grooming: Reports Difficulty climbing stairs: Reports Difficulty getting out of chair: Reports Difficulty using hands for taps, buttons, cutlery, and/or writing: Reports  Review of Systems  Constitutional:  Positive for fatigue.  HENT:  Negative for mouth sores, mouth dryness and nose dryness.   Eyes:  Positive for pain, itching, visual disturbance and dryness.  Respiratory:  Negative for cough, hemoptysis, shortness of breath and difficulty breathing.   Cardiovascular:  Positive for palpitations. Negative for chest pain and swelling in legs/feet.  Gastrointestinal:  Negative for abdominal pain, blood in stool, constipation and diarrhea.  Endocrine: Negative for increased urination.  Genitourinary:  Negative for painful urination.  Musculoskeletal:  Positive for joint pain, joint pain and morning stiffness. Negative for joint swelling, myalgias, muscle weakness, muscle tenderness and myalgias.  Skin:  Positive for color change. Negative for rash and redness.  Allergic/Immunologic: Negative for susceptible to infections.  Neurological:  Positive for headaches. Negative for dizziness, numbness, memory loss and weakness.  Hematological:  Negative for swollen  glands.  Psychiatric/Behavioral:  Positive for sleep disturbance. Negative for confusion.    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[place])    Anxiety     Past Medical History:  Diagnosis Date   Ankylosing spondylitis (HCC)    Anxiety    Arthritis    Asthma    GERD (gastroesophageal reflux disease)    Hiatal hernia    Hyperlipidemia    Menopause    Shingles     Family History  Problem Relation Age of Onset   Hypertension Mother    Heart attack Father    Healthy Brother    HIV Son    Lupus Cousin    Rheum arthritis Cousin    Past Surgical History:  Procedure Laterality Date   ABDOMINAL HYSTERECTOMY     BREAST SURGERY     CARPAL TUNNEL RELEASE     GALLBLADDER SURGERY     KNEE SURGERY     Social History   Social History Narrative   Not on file   Immunization History  Administered Date(s) Administered   Influenza-Unspecified 03/05/2020   Moderna Sars-Covid-2 Vaccination 06/12/2019, 08/22/2019, 03/30/2020   Tdap 05/05/2013     Objective: Vital Signs: BP 127/87 (BP Location: Right Arm, Patient Position: Sitting, Cuff Size: Normal)   Pulse 80   Ht 5' 2.5" (1.588 m)   Wt 205 lb 9.6 oz (93.3 kg)   BMI 37.01 kg/m    Physical Exam Constitutional:      Appearance: She [name] obese.  HENT:     Right Ear: External ear normal.     Left Ear: External ear normal.  Eyes:     Conjunctiva/sclera: Conjunctivae normal.  Cardiovascular:     Rate and Rhythm: Normal rate and regular rhythm.  Pulmonary:     Effort: Pulmonary effort [name] normal.     Breath sounds: Normal breath sounds.  Skin:    General: Skin [name] warm and dry.  Neurological:     Mental Status: She [name] alert.  Psychiatric:        Mood and Affect: Mood normal.     Musculoskeletal Exam:  Neck full ROM no tenderness Shoulders  full ROM no tenderness or swelling Elbows full ROM no tenderness or swelling Wrists full ROM no tenderness or swelling Fingers full ROM no tenderness or swelling Hip normal internal and external rotation range of movement, tenderness over the right SI joint and right lateral hip to palpation Knees full ROM no tenderness or swelling Ankles full ROM no tenderness or swelling MTPs full ROM no tenderness or swelling    Investigation: No additional findings.  Imaging: DG Knee 1-2 Views Left  Result Date: 12/21/2020 CLINICAL DATA:  Right knee pain EXAM: LEFT KNEE - 1-2 VIEW COMPARISON:  None. FINDINGS: Two views of the left knee were provided for comparison purposes. Bilateral AP and Lutricia Feil views of the left knee demonstrate normal alignment. No definite fracture or dislocation. Medial and lateral compartment joint spaces are preserved. IMPRESSION: Negative limited examination of the left knee.  And Electronically Signed   By: Fidela Salisbury MD   On: 12/21/2020 21:49   MR CERVICAL  SPINE WO CONTRAST  Result Date: 12/27/2020 CLINICAL DATA:  Cervical degenerative disc disease. Chronic neck pain and headaches. Cervical radiculopathy. Right-sided neck pain radiating to the right pericranium. EXAM: MRI CERVICAL SPINE WITHOUT CONTRAST TECHNIQUE: Multiplanar, multisequence MR imaging of the cervical spine was performed. No intravenous contrast was administered. COMPARISON:  Cervical spine radiographs 11/18/2020 FINDINGS: Alignment: Slight reversal of the normal cervical lordosis. No listhesis. Vertebrae: No fracture, suspicious marrow lesion, or significant marrow edema. Cord: Normal signal. Posterior Fossa, vertebral arteries, paraspinal tissues: Unremarkable. Disc levels: C2-3: Mild left facet arthrosis without disc herniation or stenosis. C3-4: Negative. C4-5: Negative. C5-6: Mild disc bulging and a right paracentral disc protrusion result in mild right-sided spinal stenosis with slight ventral cord  flattening and borderline to mild medial right neural foraminal stenosis. C6-7: Disc bulging and a left foraminal disc protrusion result in mild spinal stenosis and moderate left neural foraminal stenosis with potential left C7 nerve root impingement. C7-T1: Negative. IMPRESSION: 1. Mild cervical spondylosis with mild spinal stenosis at C5-6 and C6-7. 2. Moderate left neural foraminal stenosis at C6-7 due to a disc protrusion. Electronically Signed   By: Logan Bores M.D.   On: 12/27/2020 09:40   DG Knee Complete 4 Views Right  Result Date: 12/21/2020 CLINICAL DATA:  Right knee pain EXAM: RIGHT KNEE - COMPLETE 4+ VIEW COMPARISON:  None. FINDINGS: No evidence of fracture, dislocation, or joint effusion. No evidence of arthropathy or other focal bone abnormality. Soft tissues are unremarkable. IMPRESSION: Negative. Electronically Signed   By: Fidela Salisbury MD   On: 12/21/2020 21:47   DG Foot Complete Right  Result Date: 12/21/2020 CLINICAL DATA:  Right foot pain, Morton's neuroma EXAM: RIGHT FOOT COMPLETE - 3+ VIEW COMPARISON:  None. FINDINGS: There [name] no evidence of fracture or dislocation. Tiny plantar calcaneal spur. There [name] no evidence of arthropathy or other focal bone abnormality. Soft tissues are unremarkable. IMPRESSION: Negative. Electronically Signed   By: Fidela Salisbury MD   On: 12/21/2020 21:50   XR Lumbar Spine 2-3 Views  Result Date: 01/02/2021 X-ray lumbar spine 2 views Grossly normal disc space height throughout visualized portion of spine.  No significant retrolisthesis seen.  Some possible loss of the normal lumbar lordosis.  Probable facet arthropathy at L5. Impression Some degenerative arthritis changes no ankylosis or erosions seen  XR Pelvis 1-2 Views  Result Date: 01/02/2021 X-ray pelvis 2 views AP and Ferguson SI joints are patent bilaterally mild sclerosis on the iliac surface there [name] joint narrowing in the superior portion of the right SI joint.  No obvious erosion or  effusion.  Femoral acetabular joint space appears normal bilaterally.  No abnormal calcifications or bone mineralization. Impression Possible sacroiliitis changes in the superior portion of right SI joint without dramatic change and no erosions seen, can benefit with more advanced imaging ot confirm given prior clinical diagnosis of axial spondylarthritis   Recent Labs: Lab Results  Component Value Date   WBC 11.9 (H) 12/28/2020   HGB 15.0 12/28/2020   PLT 345 12/28/2020   NA 139 12/28/2020   K 4.2 12/28/2020   CL 103 12/28/2020   CO2 28 12/28/2020   GLUCOSE 100 (H) 12/28/2020   BUN 12 12/28/2020   CREATININE 0.77 12/28/2020   BILITOT 0.8 12/28/2020   ALKPHOS 91 07/01/2020   AST 17 12/28/2020   ALT 27 12/28/2020   PROT 7.4 12/28/2020   ALBUMIN 4.6 07/01/2020   CALCIUM 9.4 12/28/2020   GFRAA 87 09/16/2020   QFTBGOLDPLUS NEGATIVE  12/28/2020    Speciality Comments: No specialty comments available.  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  History and exam [name] nonspecific we will check inflammatory markers and x-ray of lumbar spine and pelvis today.  If findings are inconclusive but also consider MRI for better characterization of active disease versus degenerative arthritis.  DDD (degenerative disc disease), cervical  Neck pain with known degenerative disease in the lower portion of cervical spine.  Discussed this can be associated with the axial spondylarthritis the lumbar spine radiographic changes would generally proceed this if coming from  the inflammatory problem.  High risk medication use - Plan: CBC with Differential/Platelet, COMPLETE METABOLIC PANEL WITH GFR, Hepatitis B core antibody, IgM, Hepatitis B surface antigen, Hepatitis C antibody, HIV Antibody (routine testing w rflx), QuantiFERON-TB Gold Plus  Consider restarting of biologic DMARD for inflammatory arthritis we will check medication monitoring with CBC CMP hepatitis serology HIV and QuantiFERON today.  Orders: Orders Placed This Encounter  Procedures   XR Pelvis 1-2 Views   XR Lumbar Spine 2-3 Views   CBC with Differential/Platelet   COMPLETE METABOLIC PANEL WITH GFR   Hepatitis B core antibody, IgM   Hepatitis B surface antigen   Hepatitis C antibody   HIV Antibody (routine testing w rflx)   QuantiFERON-TB Gold Plus   Sedimentation rate   C-reactive protein    No orders of the defined types were placed in this encounter.   Follow-Up Instructions: No follow-ups on file.   Collier Salina, MD  Note - This record has been created using [employer].  Chart creation errors have been sought, but may not always  have been located. Such creation errors do not reflect on  the standard of medical care.

## 2020-12-28 ENCOUNTER — Ambulatory Visit: Payer: Self-pay

## 2020-12-28 ENCOUNTER — Encounter: Payer: Self-pay | Admitting: Internal Medicine

## 2020-12-28 ENCOUNTER — Encounter: Payer: Self-pay | Admitting: Family Medicine

## 2020-12-28 ENCOUNTER — Ambulatory Visit: Payer: BC Managed Care – PPO | Admitting: Family Medicine

## 2020-12-28 ENCOUNTER — Ambulatory Visit: Payer: BC Managed Care – PPO | Admitting: Internal Medicine

## 2020-12-28 ENCOUNTER — Other Ambulatory Visit: Payer: Self-pay

## 2020-12-28 VITALS — BP 127/87 | HR 80 | Ht 62.5 in | Wt 205.6 lb

## 2020-12-28 DIAGNOSIS — G5761 Lesion of plantar nerve, right lower limb: Secondary | ICD-10-CM | POA: Diagnosis not present

## 2020-12-28 DIAGNOSIS — M5459 Other low back pain: Secondary | ICD-10-CM

## 2020-12-28 DIAGNOSIS — M4690 Unspecified inflammatory spondylopathy, site unspecified: Secondary | ICD-10-CM

## 2020-12-28 DIAGNOSIS — M5030 Other cervical disc degeneration, unspecified cervical region: Secondary | ICD-10-CM | POA: Diagnosis not present

## 2020-12-28 DIAGNOSIS — Z79899 Other long term (current) drug therapy: Secondary | ICD-10-CM | POA: Diagnosis not present

## 2020-12-28 DIAGNOSIS — Z111 Encounter for screening for respiratory tuberculosis: Secondary | ICD-10-CM | POA: Diagnosis not present

## 2020-12-28 NOTE — Progress Notes (Signed)
Breanna [name] - 50 y.o. female MRN [id number]  Date of birth: 11-19-1970  SUBJECTIVE:  Including CC & ROS.  Chief Complaint  Patient presents with   New Patient (Initial Visit)    Referred for orthotics     Breanna [name] is a 50 y.o. female that is presenting with right foot pain.  She has had numbness and tingling in the toes of the foot..   Review of Systems See HPI   HISTORY: Past Medical, Surgical, Social, and Family History Reviewed & Updated per EMR.   Pertinent Historical Findings include:  Past Medical History:  Diagnosis Date   Ankylosing spondylitis (HCC)    Anxiety    Arthritis    Asthma    GERD (gastroesophageal reflux disease)    Hiatal hernia    Hyperlipidemia    Menopause    Shingles     Past Surgical History:  Procedure Laterality Date   ABDOMINAL HYSTERECTOMY     BREAST SURGERY     CARPAL TUNNEL RELEASE     GALLBLADDER SURGERY     KNEE SURGERY      Family History  Problem Relation Age of Onset   Hypertension Mother    Heart attack Father    Healthy Brother    HIV Son    Lupus Cousin    Rheum arthritis Cousin     Social History   Socioeconomic History   Marital status: Married    Spouse name: Not on file   Number of children: 1   Years of education: Not on file   Highest education level: Not on file  Occupational History   Occupation: Scientist, product/process development: [employer]  Tobacco Use   Smoking status: Never   Smokeless tobacco: Never  Vaping Use   Vaping Use: Never used  Substance and Sexual Activity   Alcohol use: Yes   Drug use: Never   Sexual activity: Yes    Birth control/protection: Surgical  Other Topics Concern   Not on file  Social History Narrative   Not on file   Social Determinants of Health   Financial Resource Strain: Not on file  Food Insecurity: Not on file  Transportation Needs: Not on file  Physical Activity: Not on file  Stress: Not on file  Social Connections: Not on file   Intimate Partner Violence: Not on file     PHYSICAL EXAM:  VS: BP 126/84   Pulse 86   Ht 5' 2.5" (1.588 m)   Wt 204 lb (92.5 kg)   SpO2 96%   BMI 36.72 kg/m  Physical Exam Gen: NAD, alert, cooperative with exam, well-appearing MSK:  Right foot: Tenderness between the second third interdigit webspace. No swelling or ecchymosis. Normal strength resistance. Neurovascular intact  Patient was fitted for a standard, cushioned, semi-rigid orthotic. The orthotic was heated and afterward the patient stood on the orthotic blank positioned on the orthotic stand. The patient was positioned in subtalar neutral position and 10 degrees of ankle dorsiflexion in a weight bearing stance. After completion of molding, a stable base was applied to the orthotic blank. The blank was ground to a stable position for weight bearing. Size: 58M Pairs: 2 Base: Blue EVA Additional Posting and Padding: Neuroma pad placed on right The patient ambulated these, and they were very comfortable.    ASSESSMENT & PLAN:   Morton's neuroma, right Symptoms most suggestive of neuroma. -Counseled on home exercise therapy and supportive care. -Orthotics with neuroma pad.

## 2020-12-29 NOTE — Assessment & Plan Note (Signed)
Symptoms most suggestive of neuroma. -Counseled on home exercise therapy and supportive care. -Orthotics with neuroma pad.

## 2020-12-31 LAB — C-REACTIVE PROTEIN: CRP: 3.5 mg/L (ref <8.0)

## 2020-12-31 LAB — CBC WITH DIFFERENTIAL/PLATELET
Absolute Monocytes: 547 cells/uL (ref 200–950)
Basophils Absolute: 36 cells/uL (ref 0–200)
Basophils Relative: 0.3 %
Eosinophils Absolute: 262 cells/uL (ref 15–500)
Eosinophils Relative: 2.2 %
HCT: 45.5 % — ABNORMAL HIGH (ref 35.0–45.0)
Hemoglobin: 15 g/dL (ref 11.7–15.5)
Lymphs Abs: 3392 cells/uL (ref 850–3900)
MCH: 28.6 pg (ref 27.0–33.0)
MCHC: 33 g/dL (ref 32.0–36.0)
MCV: 86.7 fL (ref 80.0–100.0)
MPV: 9.1 fL (ref 7.5–12.5)
Monocytes Relative: 4.6 %
Neutro Abs: 7664 cells/uL (ref 1500–7800)
Neutrophils Relative %: 64.4 %
Platelets: 345 10*3/uL (ref 140–400)
RBC: 5.25 10*6/uL — ABNORMAL HIGH (ref 3.80–5.10)
RDW: 14.6 % (ref 11.0–15.0)
Total Lymphocyte: 28.5 %
WBC: 11.9 10*3/uL — ABNORMAL HIGH (ref 3.8–10.8)

## 2020-12-31 LAB — COMPLETE METABOLIC PANEL WITH GFR
AG Ratio: 1.5 (calc) (ref 1.0–2.5)
ALT: 27 U/L (ref 6–29)
AST: 17 U/L (ref 10–35)
Albumin: 4.4 g/dL (ref 3.6–5.1)
Alkaline phosphatase (APISO): 80 U/L (ref 31–125)
BUN: 12 mg/dL (ref 7–25)
CO2: 28 mmol/L (ref 20–32)
Calcium: 9.4 mg/dL (ref 8.6–10.2)
Chloride: 103 mmol/L (ref 98–110)
Creat: 0.77 mg/dL (ref 0.50–0.99)
Globulin: 3 g/dL (calc) (ref 1.9–3.7)
Glucose, Bld: 100 mg/dL — ABNORMAL HIGH (ref 65–99)
Potassium: 4.2 mmol/L (ref 3.5–5.3)
Sodium: 139 mmol/L (ref 135–146)
Total Bilirubin: 0.8 mg/dL (ref 0.2–1.2)
Total Protein: 7.4 g/dL (ref 6.1–8.1)
eGFR: 95 mL/min/{1.73_m2} (ref >=60)

## 2020-12-31 LAB — HEPATITIS C ANTIBODY
Hepatitis C Ab: NONREACTIVE
SIGNAL TO CUT-OFF: 0.01 (ref <1.00)

## 2020-12-31 LAB — QUANTIFERON-TB GOLD PLUS
Mitogen-NIL: >10 IU/mL
NIL: 0.08 IU/mL
QuantiFERON-TB Gold Plus: NEGATIVE
TB1-NIL: <0 IU/mL
TB2-NIL: <0 IU/mL

## 2020-12-31 LAB — HEPATITIS B SURFACE ANTIGEN: Hepatitis B Surface Ag: NONREACTIVE

## 2020-12-31 LAB — HEPATITIS B CORE ANTIBODY, IGM: Hep B C IgM: NONREACTIVE

## 2020-12-31 LAB — SEDIMENTATION RATE: Sed Rate: 2 mm/h (ref 0–20)

## 2020-12-31 LAB — HIV ANTIBODY (ROUTINE TESTING W REFLEX): HIV 1&2 Ab, 4th Generation: NONREACTIVE

## 2021-01-04 NOTE — Addendum Note (Signed)
Addended by: Fuller Plan on: 01/04/2021 03:18 PM   Modules accepted: Orders

## 2021-01-04 NOTE — Progress Notes (Signed)
Blood tests look fine for medication use. Inflammation markers are not high. Review of xrays is suggestive for right sacroiliac joint changes but not very clear. I think MRI would be beneficial to see if there is current inflammation if she is agreeable to this.

## 2021-01-09 ENCOUNTER — Ambulatory Visit: Payer: BC Managed Care – PPO

## 2021-01-09 ENCOUNTER — Other Ambulatory Visit: Payer: Self-pay

## 2021-01-09 DIAGNOSIS — M4690 Unspecified inflammatory spondylopathy, site unspecified: Secondary | ICD-10-CM

## 2021-01-09 DIAGNOSIS — M533 Sacrococcygeal disorders, not elsewhere classified: Secondary | ICD-10-CM | POA: Diagnosis not present

## 2021-01-09 DIAGNOSIS — R600 Localized edema: Secondary | ICD-10-CM | POA: Diagnosis not present

## 2021-01-09 DIAGNOSIS — Z90710 Acquired absence of both cervix and uterus: Secondary | ICD-10-CM | POA: Diagnosis not present

## 2021-01-09 IMAGING — MR MR SACRUM / SI JOINTS WO CM
5 series · 48 of 48 positions shown · non-contrast
Comparison: Pelvis x-ray dated [DATE].

CLINICAL DATA: Chronic low back, bilateral hip, and right
sacroiliac joint pain. History of ankylosing spondylitis.

EXAM:
MRI SACRUM WITHOUT CONTRAST
TECHNIQUE: Multiplanar, multisequence MR imaging of the sacrum was performed.
No intravenous contrast was administered.

[Series 5: T1 · axial · 4.0mm · 0.94mm/px · z∈[-77,+44]mm · 10 of 29 slices shown (1 of 2)]
[im 1/29]
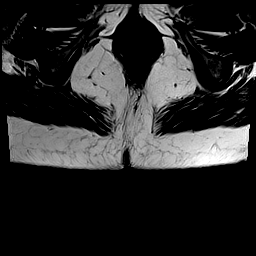
[im 4/29]
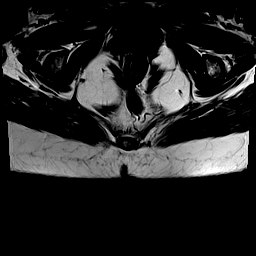
[im 7/29]
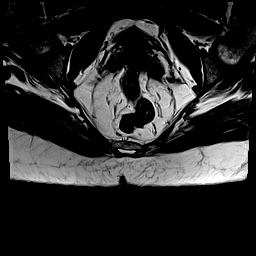
[im 10/29]
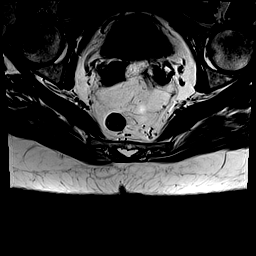
[im 13/29]
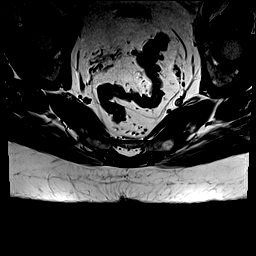
[im 16/29]
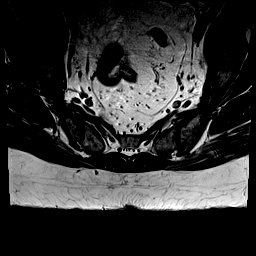
[im 19/29]
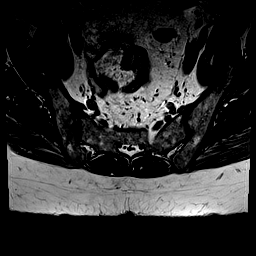
[im 22/29]
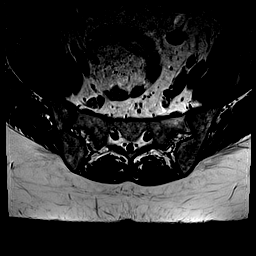
[im 25/29]
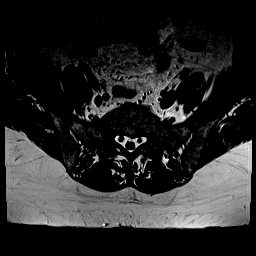
[im 29/29]
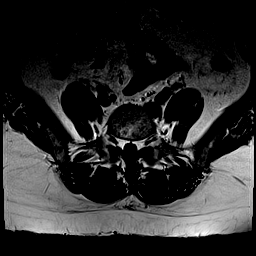

[Series 6: T2 fat-sat · axial · 4.0mm · 0.94mm/px · z∈[-77,+44]mm · 10 of 29 slices shown (1 of 2)]
[im 1/29]
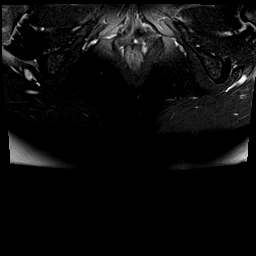
[im 4/29]
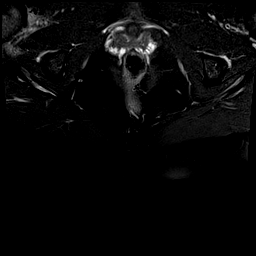
[im 7/29]
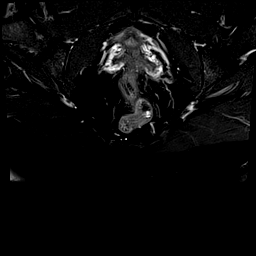
[im 10/29]
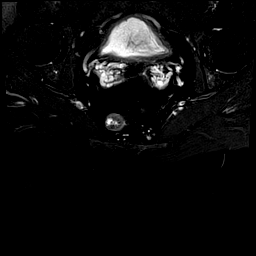
[im 13/29]
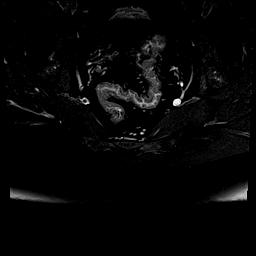
[im 16/29]
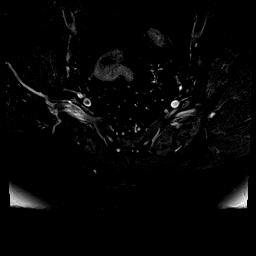
[im 19/29]
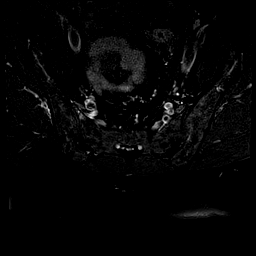
[im 22/29]
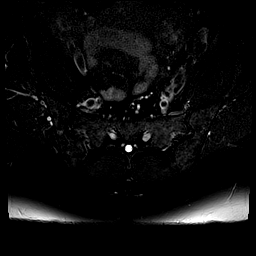
[im 25/29]
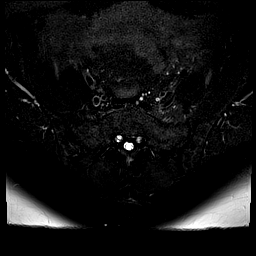
[im 29/29]
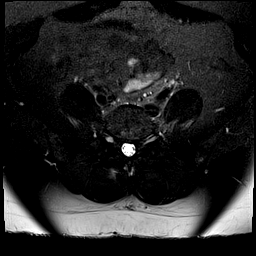

[Series 7: T1 · oblique · 3.0mm · 0.94mm/px · 9 of 25 slices shown (2 of 2)]
[im 1/25]
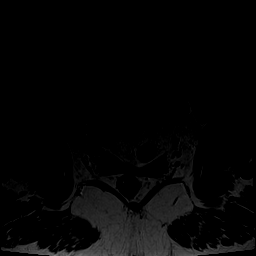
[im 4/25]
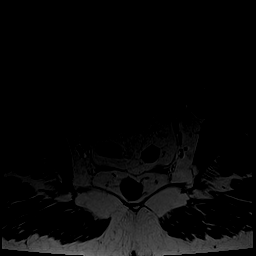
[im 7/25]
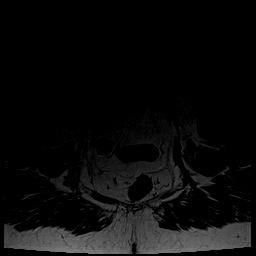
[im 10/25]
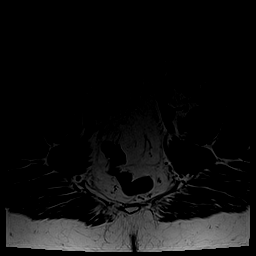
[im 13/25]
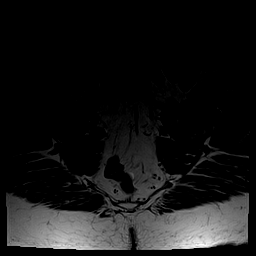
[im 16/25]
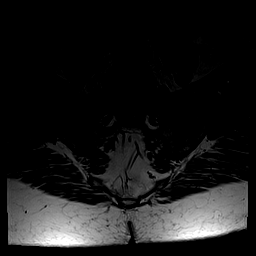
[im 19/25]
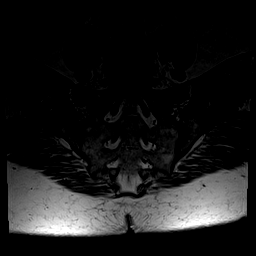
[im 22/25]
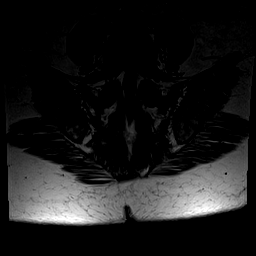
[im 25/25]
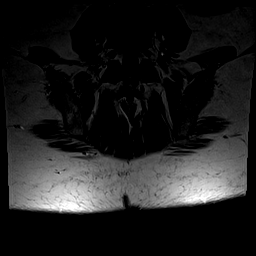

[Series 8: STIR · oblique · 3.0mm · 0.94mm/px · 9 of 25 slices shown]
[im 1/25]
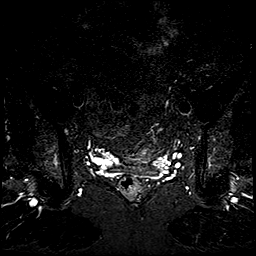
[im 4/25]
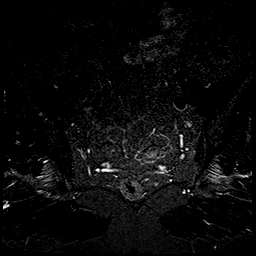
[im 7/25]
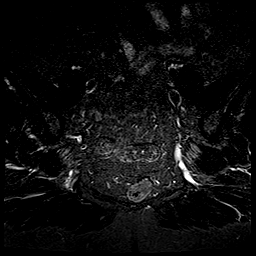
[im 10/25]
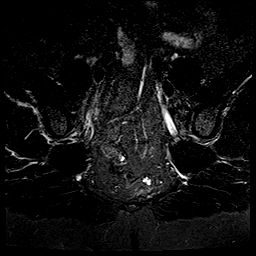
[im 13/25]
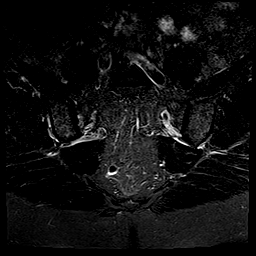
[im 16/25]
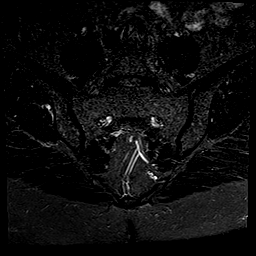
[im 19/25]
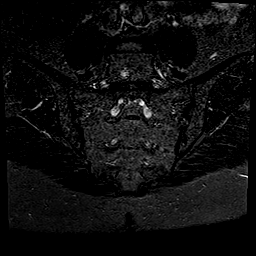
[im 22/25]
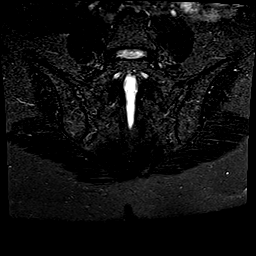
[im 25/25]
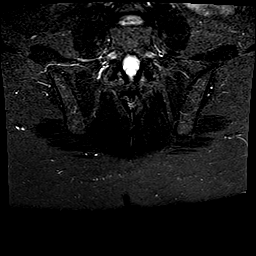

[Series 9: T2 fat-sat · sagittal · 4.0mm · 0.94mm/px · 10 of 28 slices shown (2 of 2)]
[im 1/28]
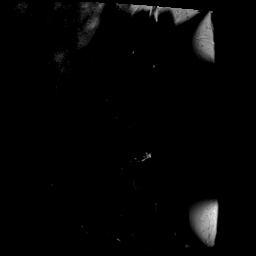
[im 4/28]
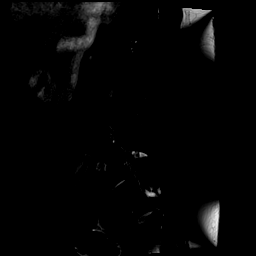
[im 7/28]
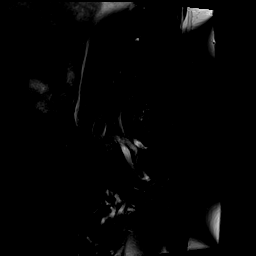
[im 10/28]
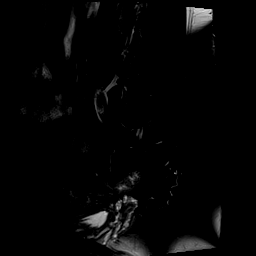
[im 13/28]
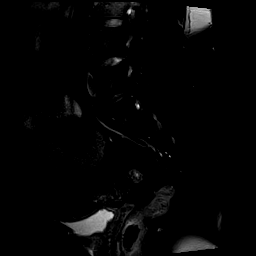
[im 16/28]
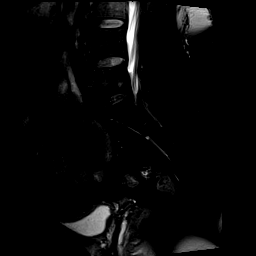
[im 19/28]
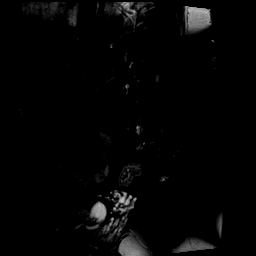
[im 22/28]
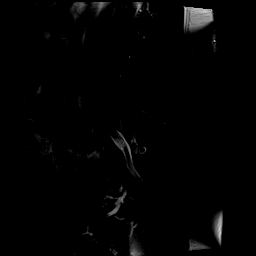
[im 25/28]
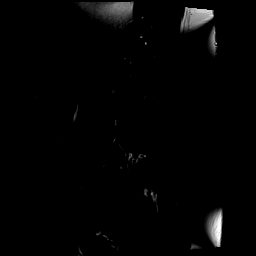
[im 28/28]
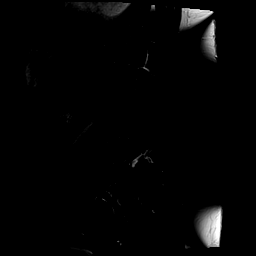

[48 of 48 positions shown; findings below may reference images not displayed]

FINDINGS: Bones/Joint/Cartilage

Mild marrow edema along the anterior sacral aspect of the right
sacroiliac joint (series 6, image 10). No erosive changes, joint
space widening, or joint effusion. No fracture or dislocation.

Muscles and Tendons
Intact.  No muscle edema or atrophy.

Soft tissue
No fluid collection or hematoma. No soft tissue mass. Prior
hysterectomy.
IMPRESSION: 1. Mild marrow edema along the anterior sacral aspect of the right
sacroiliac joint without erosive changes, joint space widening, or
joint effusion. This is nonspecific and could be degenerative or
related to early sacroiliitis.

## 2021-01-11 ENCOUNTER — Telehealth: Payer: Self-pay | Admitting: Pharmacist

## 2021-01-11 NOTE — Progress Notes (Signed)
The same area that looked abnormal on your xray looked abnormal on your MRI. There was no long term damage, but MRI showing possible current inflammation. I would recommend we try treating this with Enbrel, similar medicine to the Humira you have took before. Your blood tests we checked look okay for this, so we could proceed if you are interested in doing so. Otherwise we could wait and see how symptoms are doing and follow up in a few months.

## 2021-01-11 NOTE — Progress Notes (Signed)
Do you think this is something that would be a lot of work to Psychologist, sport and exercise if she is changing policies so we should wait till she switches or okay to go ahead with process? She has no current radiographic evidence of long term joint damage from this, so I don't believe a short term delay would be harmful.

## 2021-01-11 NOTE — Telephone Encounter (Addendum)
Please start Enbrel Mini BIV.  Dose: 50mg  every 7 days  Dx: AS (M46.90)  Previously tried therapies: Humira (Inadequate response)  Her current insurance will lapse on 02/02/21. She will have new insurance plan start 60 days from 01/10/21 (which is 03/11/21)  05/11/21, PharmD, MPH, BCPS Clinical Pharmacist (Rheumatology and Pulmonology)

## 2021-01-12 NOTE — Progress Notes (Signed)
I don't think that would be too long, my main priority would be to avoid interruptions in treatment shortly after starting.

## 2021-01-12 NOTE — Telephone Encounter (Signed)
Submitted a Prior Authorization request to [facility] for ENBREL via CoverMyMeds. Will update once we receive a response.   Key: [id number]

## 2021-01-13 ENCOUNTER — Other Ambulatory Visit (HOSPITAL_COMMUNITY): Payer: Self-pay

## 2021-01-13 DIAGNOSIS — Z20822 Contact with and (suspected) exposure to covid-19: Secondary | ICD-10-CM | POA: Diagnosis not present

## 2021-01-13 NOTE — Telephone Encounter (Signed)
Received notification from Breanna [name] regarding a prior authorization for ENBREL. Authorization has been APPROVED from 01/12/21 to 01/11/22.   Patient must fill through [facility]: [phone]   Authorization # [facility]  Unable to run test claim, Breanna [name] has not yet been updated. Will attempt again tomorrow.  Patient is eligible for the Enbrel copay card.

## 2021-01-14 ENCOUNTER — Other Ambulatory Visit (HOSPITAL_COMMUNITY): Payer: Self-pay

## 2021-01-14 NOTE — Telephone Encounter (Signed)
Called patient and confirmed address and email to enroll into enbrel Savings card. Has been advised that savings card is debit card.  Patient has been scheduled for Enbrel new start visit on 01/24/21.  Chesley Mires, PharmD, MPH, BCPS Clinical Pharmacist (Rheumatology and Pulmonology)

## 2021-01-14 NOTE — Telephone Encounter (Signed)
Ran test claim, patient's copay for 1 month is $200, she can enroll for Enbrel copay card

## 2021-01-19 DIAGNOSIS — Z20822 Contact with and (suspected) exposure to covid-19: Secondary | ICD-10-CM | POA: Diagnosis not present

## 2021-01-21 ENCOUNTER — Telehealth: Payer: Self-pay

## 2021-01-21 NOTE — Telephone Encounter (Signed)
Patient called to cancel her Enbrel new start appointment scheduled for Monday, 01/24/21.  Patient states she tested positive for Covid on Wednesday, 01/19/21.  Patient states she has cough, congestion, and headache.  Patient requested a return call to discuss when to reschedule.

## 2021-01-24 ENCOUNTER — Telehealth: Payer: Self-pay | Admitting: Medical-Surgical

## 2021-01-24 ENCOUNTER — Ambulatory Visit: Payer: Self-pay | Admitting: Medical-Surgical

## 2021-01-24 ENCOUNTER — Ambulatory Visit: Payer: BC Managed Care – PPO | Admitting: Pharmacist

## 2021-01-24 DIAGNOSIS — F419 Anxiety disorder, unspecified: Secondary | ICD-10-CM

## 2021-01-24 DIAGNOSIS — Z5329 Procedure and treatment not carried out because of patient's decision for other reasons: Secondary | ICD-10-CM

## 2021-01-24 DIAGNOSIS — F5101 Primary insomnia: Secondary | ICD-10-CM

## 2021-01-24 NOTE — Telephone Encounter (Signed)
Would recommend starting Enbrel 1-2 weeks after symptom resolution. She is vaccinated x 3. She states that she  She states she reached out to PCP on Friday,  01/21/21 for oral COVID19 treatment but did not receive call back. Symptoms started on Wednesday, 01/19/21, so she is outside of 5-day window for eligibility for oral antiviral treatment now.  Patient will reach out to pharmacy team when it has been 1 week after symptom resolution  Chesley Mires, PharmD, MPH, BCPS Clinical Pharmacist (Rheumatology and Pulmonology)

## 2021-01-24 NOTE — Telephone Encounter (Signed)
Pt called. She got a No Show for her appt today. She states she called on Friday 8/19 to cancel this appt because she has COVID.  Thank you.

## 2021-01-24 NOTE — Progress Notes (Signed)
   Complete physical exam  Patient: Breanna [name]   DOB: 03/25/1999   50 y.o. Female  MRN: [id number]  Subjective:    No chief complaint on file.   Breanna [name] is a 50 y.o. female who presents today for a complete physical exam. She reports consuming a {diet types:17450} diet. {types:19826} She generally feels {DESC; WELL/FAIRLY WELL/POORLY:18703}. She reports sleeping {DESC; WELL/FAIRLY WELL/POORLY:18703}. She {does/does not:200015} have additional problems to discuss today.    Most recent fall risk assessment:    11/30/2021   10:42 AM  Fall Risk   Falls in the past year? 0  Number falls in past yr: 0  Injury with Fall? 0  Risk for fall due to : No Fall Risks  Follow up Falls evaluation completed     Most recent depression screenings:    11/30/2021   10:42 AM 10/21/2020   10:46 AM  PHQ 2/9 Scores  PHQ - 2 Score 0 0  PHQ- 9 Score 5     {VISON DENTAL STD PSA (Optional):27386}  {History (Optional):23778}  Patient Care Team: Margrete Delude, NP as PCP - General (Nurse Practitioner)   Outpatient Medications Prior to Visit  Medication Sig   fluticasone (FLONASE) 50 MCG/ACT nasal spray Place 2 sprays into both nostrils in the morning and at bedtime. After 7 days, reduce to once daily.   norgestimate-ethinyl estradiol (SPRINTEC 28) 0.25-35 MG-MCG tablet Take 1 tablet by mouth daily.   Nystatin POWD Apply liberally to affected area 2 times per day   spironolactone (ALDACTONE) 100 MG tablet Take 1 tablet (100 mg total) by mouth daily.   No facility-administered medications prior to visit.    ROS        Objective:     There were no vitals taken for this visit. {Vitals History (Optional):23777}  Physical Exam   No results found for any visits on 01/05/22. {Show previous labs (optional):23779}    Assessment & Plan:    Routine Health Maintenance and Physical Exam  Immunization History  Administered Date(s) Administered   DTaP 06/08/1999, 08/04/1999,  10/13/1999, 06/28/2000, 01/12/2004   Hepatitis A 11/08/2007, 11/13/2008   Hepatitis B 03/26/1999, 05/03/1999, 10/13/1999   HiB (PRP-OMP) 06/08/1999, 08/04/1999, 10/13/1999, 06/28/2000   IPV 06/08/1999, 08/04/1999, 04/02/2000, 01/12/2004   Influenza,inj,Quad PF,6+ Mos 02/13/2014   Influenza-Unspecified 05/15/2012   MMR 04/02/2001, 01/12/2004   Meningococcal Polysaccharide 11/13/2011   Pneumococcal Conjugate-13 06/28/2000   Pneumococcal-Unspecified 10/13/1999, 12/27/1999   Tdap 11/13/2011   Varicella 04/02/2000, 11/08/2007    Health Maintenance  Topic Date Due   HIV Screening  Never done   Hepatitis C Screening  Never done   INFLUENZA VACCINE  01/03/2022   PAP-Cervical Cytology Screening  01/05/2022 (Originally 03/24/2020)   PAP SMEAR-Modifier  01/05/2022 (Originally 03/24/2020)   TETANUS/TDAP  01/05/2022 (Originally 11/12/2021)   HPV VACCINES  Discontinued   COVID-19 Vaccine  Discontinued    Discussed health benefits of physical activity, and encouraged her to engage in regular exercise appropriate for her age and condition.  Problem List Items Addressed This Visit   None Visit Diagnoses     Annual physical exam    -  Primary   Cervical cancer screening       Need for Tdap vaccination          No follow-ups on file.     Lorely Bubb, NP   

## 2021-01-25 NOTE — Telephone Encounter (Signed)
Patient tested positive for COVID on 01/19/21. She plans to reach back out to schedule Enbrel new start visit once she is 1 week past symptom resolution  Chesley Mires, PharmD, MPH, BCPS Clinical Pharmacist (Rheumatology and Pulmonology)

## 2021-02-14 NOTE — Telephone Encounter (Signed)
Left VM for patient to schedule Enbrel new start visit and assess if COVID19 symptoms have resolved. Left VM requesting return call to review  Chesley Mires, PharmD, MPH, BCPS Clinical Pharmacist (Rheumatology and Pulmonology)

## 2021-02-14 NOTE — Telephone Encounter (Signed)
Spoke to pt. I told her once she gets the bill for her No Show to call us and we will remove it at that time.

## 2021-02-21 NOTE — Telephone Encounter (Addendum)
Received signed patient portion via email.  Submitted Patient Assistance Application to [employer] for ENBREL along with provider portion, signed patient portion, med list. Will update patient when we receive a response.  Fax# [phone] Phone# [phone]  Chesley Mires, PharmD, MPH, BCPS Clinical Pharmacist (Rheumatology and Pulmonology)

## 2021-02-21 NOTE — Telephone Encounter (Signed)
Called patient who states that her BCBS insurance ended on 02/02/21. She is uninsured through 03/18/21.  We reviewed that she can apply for patient assistance program since new insurance will not kick in for a month. Emailed application to her and advised to complete and email back to pharmacy team  Will complete provider portion today and place in pharmacy office under "pending patient info"  Chesley Mires, PharmD, MPH, BCPS Clinical Pharmacist (Rheumatology and Pulmonology)

## 2021-02-22 NOTE — Telephone Encounter (Signed)
Received a fax from  [employer] regarding an approval for ENBREL patient assistance from 02/21/21 to 02/21/22.   Phone number: [phone]  Called patient to advise. Unable to reach - left VM requesting return call to schedule new start and for her to order her shipment  Case # [id number]  Chesley Mires, PharmD, MPH, BCPS Clinical Pharmacist (Rheumatology and Pulmonology)

## 2021-02-23 NOTE — Telephone Encounter (Signed)
Patient returned call and scheduled for Enbrel new start on 02/24/21. She has Enbrel shipment scheduled to be delivered to her home today. She has been advised that we will use a sample for her first dose tomorrow.  Chesley Mires, PharmD, MPH, BCPS Clinical Pharmacist (Rheumatology and Pulmonology)

## 2021-02-24 ENCOUNTER — Other Ambulatory Visit: Payer: Self-pay

## 2021-02-24 ENCOUNTER — Ambulatory Visit: Payer: Self-pay | Admitting: Pharmacist

## 2021-02-24 DIAGNOSIS — Z7189 Other specified counseling: Secondary | ICD-10-CM

## 2021-02-24 DIAGNOSIS — M4690 Unspecified inflammatory spondylopathy, site unspecified: Secondary | ICD-10-CM

## 2021-02-24 DIAGNOSIS — Z79899 Other long term (current) drug therapy: Secondary | ICD-10-CM

## 2021-02-24 MED ORDER — ENBREL MINI 50 MG/ML ~~LOC~~ SOCT: 50.0000 mg | SUBCUTANEOUS | 0 refills | Status: DC

## 2021-02-24 NOTE — Progress Notes (Signed)
Pharmacy Note  Subjective:   Patient presents to clinic today to receive first dose of Enbrel for AS. She previously took Humira with inadequate response. She did not have any injection site reaction but does report feeling flu-like symptoms that day after her injections which is why she took her dose on Friday's.  Patient running a fever or have signs/symptoms of infection? No  Patient currently on antibiotics for the treatment of infection? No  Patient have any upcoming invasive procedures/surgeries? No  Objective: CMP     Component Value Date/Time   NA 139 12/28/2020 0956   NA 142 07/01/2020 0000   K 4.2 12/28/2020 0956   CL 103 12/28/2020 0956   CO2 28 12/28/2020 0956   GLUCOSE 100 (H) 12/28/2020 0956   BUN 12 12/28/2020 0956   CREATININE 0.77 12/28/2020 0956   CALCIUM 9.4 12/28/2020 0956   PROT 7.4 12/28/2020 0956   ALBUMIN 4.6 07/01/2020 0000   AST 17 12/28/2020 0956   ALT 27 12/28/2020 0956   ALKPHOS 91 07/01/2020 0000   BILITOT 0.8 12/28/2020 0956   GFRNONAA 75 09/16/2020 0000   GFRAA 87 09/16/2020 0000    CBC    Component Value Date/Time   WBC 11.9 (H) 12/28/2020 0956   RBC 5.25 (H) 12/28/2020 0956   HGB 15.0 12/28/2020 0956   HCT 45.5 (H) 12/28/2020 0956   PLT 345 12/28/2020 0956   MCV 86.7 12/28/2020 0956   MCH 28.6 12/28/2020 0956   MCHC 33.0 12/28/2020 0956   RDW 14.6 12/28/2020 0956   LYMPHSABS 3,392 12/28/2020 0956   EOSABS 262 12/28/2020 0956   BASOSABS 36 12/28/2020 0956    Baseline Immunosuppressant Therapy Labs TB GOLD Quantiferon TB Gold Latest Ref Rng & Units 12/28/2020  Quantiferon TB Gold Plus NEGATIVE NEGATIVE   Hepatitis Panel Hepatitis Latest Ref Rng & Units 12/28/2020  Hep B Surface Ag NON-REACTIVE NON-REACTIVE  Hep B IgM NON-REACTIVE NON-REACTIVE  Hep C Ab NON-REACTIVE NON-REACTIVE  Hep C Ab NON-REACTIVE NON-REACTIVE   HIV Lab Results  Component Value Date   HIV NON-REACTIVE 12/28/2020   Immunoglobulins   SPEP Serum  Protein Electrophoresis Latest Ref Rng & Units 12/28/2020  Total Protein 6.1 - 8.1 g/dL 7.4    Assessment/Plan:  Demonstrated proper injection technique with Enbrel Mini demo device  Patient able to demonstrate proper injection technique using the teach back method.  Patient self injected in the right lower abdomen with:  Sample Medication: Enbrel Mini cartridge 50 mg/1 mL NDC: [address]-96 Lot: [id number] Expiration: 04/2023  Enbrel Medication: AutoTouch Connect Autoinjector NDC: [address]-08 Lot: [id number] Expiration: 06/04/2024  Patient tolerated well.  Observed for 30 mins in office for adverse reaction and none noted.   Patient is to return in 6-8 weeks for follow-up appointment which is scheduled for 04/14/21.  Standing orders for CBC and CMP placed today to be drawn at [facility].   Enbrel approved through [employer] patient assistance .   Rx sent to: [employer]) for Enbrel: [phone]. She is uninsured through 03/18/21 but will plan to call the pharmacy team once she has new insurance card copy on hand. She received 4 month supply at home yesterday, 02/23/21  All questions encouraged and answered.  Instructed patient to call with any further questions or concerns.  Chesley Mires, PharmD, MPH, BCPS Clinical Pharmacist (Rheumatology and Pulmonology)  02/24/2021 8:02 AM

## 2021-02-24 NOTE — Patient Instructions (Signed)
Your next ENBREL dose is due on 9/29, 10/6, and every 7 days thereafter  HOLD ENBREL if you have signs or symptoms of an infection. You can resume once you feel better or back to your baseline. HOLD ENBREL if you start antibiotics to treat an infection. HOLD ENBREL around the time of surgery/procedures. Your surgeon will be able to provide recommendations on when to hold BEFORE and when you are cleared to RESUME.  Pharmacy information: Your prescription will be shipped from [facility] while you are receiving it for free from the company. Their phone number is [phone] With your new insurance that is active on 03/18/21, the pharmacy will change. Please call us with your new insurance information.  Dr. Dimple Casey will get labs at your office visit.  How to manage an injection site reaction: Remember the 5 C's: COUNTER - leave on the counter at least 30 minutes but up to overnight to bring medication to room temperature. This may help prevent stinging COLD - place something cold (like an ice gel pack or cold water bottle) on the injection site just before cleansing with alcohol. This may help reduce pain CLARITIN - use Claritin (generic name is loratadine) for the first two weeks of treatment or the day of, the day before, and the day after injecting. This will help to minimize injection site reactions CORTISONE CREAM - apply if injection site is irritated and itching CALL ME - if injection site reaction is bigger than the size of your fist, looks infected, blisters, or if you develop hives

## 2021-04-13 NOTE — Progress Notes (Signed)
Office Visit Note  Patient: Breanna [name]             Date of Birth: 10-10-1970           MRN: [id number]             PCP: Samuel Bouche, NP Referring: Samuel Bouche, NP Visit Date: 04/14/2021   Subjective:  Follow-up (Neck and left shoulder pain)   History of Present Illness: Breanna [name] is a 50 y.o. 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 She has not noticed any large change in symptoms after starting this medication. Still has morning stiffness and low back pain, some hand pain, and some night time awakenings. She reports increased left shoulder pain especially with raising her arm overhead more since the past 2-3 weeks. She had joint injection for this in July with a good improvement until now. She also tried increasing the lyrica medication for joint pain and some cervical spine degenerative changes with possible compression, but stopped this due to feeling poorly on this and worse at higher dose.  Previous HPI 12/28/20 Breanna [name] is a 50 y.o. female here for axial and peripheral spondyloarthritis. She previously saw a rheumatologist in [place] and took Humira for this but has been off treatment for at least a year. Review of previous rheumatology records indicates most recent treatment was on methotrexate and humira with reasonably good disease control. Apparently GI intolerance of methotrexate with titration to 25 mg PO weekly but tolerated at lower doses and labs monitoring were okay. She previously had joint involvement with pain in the lower thoracic spine, lumbar spine, right SI joint, and bilateral heels. She has previously reported joint pain dating back at least 20 years in duration. She moved from [place] due to work so has been off treatment since that time. She has noticed some increase in joint pain and stiffness but worst symptom increase is fatigue. She felt Humira was controlled symptoms well initially with loss of  improvement, and addition of methotrexate did not improve much. She currently has neck and back pain mostly, at night and in the morning but also increase in neck pain during the day working. She recently underwent left shoulder injection  a month ago with improvement in pain there. Otherwise she has not taken any steroid medication recently.   Outside labs reviewed HLA-B27 pos   DMARD Hx Humira 2020-? MTX 2020-? NSAIDs - inadequate response   Imaging reviewed (report only available) 03/30/18 Xray bilateral hands No erosions 03/30/18 Xray cervical spine Mild C5-C6 probable disc degeneration 03/30/18 Xray thoracic spine No significant disc space degeneration. No definitive AS changes. 03/30/18 Xray lumbar spine Straightening of normal lumbar lordosis. No fracture. No significant disc space narrowing. 02/20/18 Xray SI joints No definitive evidence of sacroiliitis   Review of Systems  Constitutional:  Positive for fatigue.  HENT:  Negative for mouth dryness.   Eyes:  Positive for dryness.  Respiratory:  Negative for shortness of breath.   Cardiovascular:  Negative for swelling in legs/feet.  Gastrointestinal:  Negative for constipation.  Endocrine: Negative for excessive thirst.  Genitourinary:  Negative for difficulty urinating.  Musculoskeletal:  Positive for joint pain, joint pain, morning stiffness and muscle tenderness.  Skin:  Negative for rash.  Allergic/Immunologic: Negative for susceptible to infections.  Neurological:  Positive for numbness.  Hematological:  Negative for bruising/bleeding tendency.  Psychiatric/Behavioral:  Positive for sleep disturbance.    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[place])    Anxiety     Past Medical History:  Diagnosis Date   Ankylosing spondylitis (H[facility])    Anxiety    Arthritis    Asthma    GERD (gastroesophageal reflux disease)    Hiatal hernia    Hyperlipidemia    Menopause    Shingles     Family History  Problem Relation Age of Onset   Hypertension Mother    Heart attack Father    Healthy Brother    HIV Son    Lupus Cousin    Rheum arthritis Cousin    Past Surgical History:  Procedure Laterality Date   ABDOMINAL HYSTERECTOMY     BREAST SURGERY     CARPAL TUNNEL RELEASE     GALLBLADDER SURGERY     KNEE SURGERY     Social History   Social History Narrative   Not on file   Immunization History  Administered Date(s) Administered   Influenza-Unspecified 03/05/2020   Moderna Sars-Covid-2 Vaccination 06/12/2019, 08/22/2019, 03/30/2020   Tdap 05/05/2013     Objective: Vital Signs: BP 124/78 (BP Location: Left Arm, Patient Position: Sitting, Cuff Size: Normal)   Pulse 76   Resp 15   Ht 5' 3" (1.6 m)   Wt 204 lb (92.5 kg)   BMI 36.14 kg/m    Physical Exam Constitutional:      Appearance: She is obese.  Cardiovascular:     Rate and Rhythm: Normal rate and regular rhythm.  Pulmonary:     Effort: Pulmonary effort is normal.     Breath sounds: Normal breath sounds.  Skin:    General: Skin is warm and dry.     Findings: No rash.     Comments: Mild erythema without warm swelling or tenderness over distal fingers bilaterally  Neurological:     Mental Status: She is alert.  Psychiatric:        Mood and Affect: Mood normal.     Musculoskeletal Exam:  Neck full ROM no tenderness Shoulders left side pain with overhead abduction  passive ROM is intact, right side is normal Elbows full ROM no tenderness or swelling Wrists full ROM no tenderness or swelling Fingers full ROM no tenderness or swelling Knees full ROM no tenderness or swelling   Investigation: No additional findings.  Imaging: No results found.  Recent Labs: Lab Results  Component Value Date   WBC 11.9 (H) 12/28/2020   HGB 15.0 12/28/2020   PLT 345 12/28/2020   NA 139 12/28/2020   K 4.2 12/28/2020   CL 103 12/28/2020   CO2 28 12/28/2020   GLUCOSE 100 (H) 12/28/2020   BUN 12 12/28/2020   CREATININE 0.77 12/28/2020   BILITOT 0.8 12/28/2020   ALKPHOS 91 07/01/2020   AST 17 12/28/2020   ALT 27 12/28/2020   PROT 7.4 12/28/2020   ALBUMIN 4.6 07/01/2020   CALCIUM 9.4 12/28/2020   GFRAA 87 09/16/2020   QFTBGOLDPLUS NEGATIVE 12/28/2020    Speciality Comments:  No specialty comments available.  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  We will check sedimentation rate for disease activity monitoring.  I recommend she continue the Enbrel 50 mg subcu weekly for longer since this may just be too early for treatment response.  High risk medication use - Plan: CBC with Differential/Platelet, COMPLETE METABOLIC PANEL WITH GFR  Now about 6 weeks after starting Enbrel we will check CBC and CMP for drug toxicity monitoring.  Chronic left shoulder pain  Left shoulder pain looks consistent with known rotator cuff arthropathy and bursitis may be an exacerbation due to wearing off of the last steroid injection or could be some use related provocation.  Does not appear to be inflammatory arthritis related to her systemic disease.  Orders: Orders Placed This Encounter  Procedures   Sedimentation  rate   CBC with Differential/Platelet   COMPLETE METABOLIC PANEL WITH GFR    No orders of the defined types were placed in this encounter.    Follow-Up Instructions: Return in about 3 months (around 07/15/2021) for AS on Enbrel f/u 47mo.   CCollier Salina MD  Note - This record has been created using [employer]  Chart creation errors have been sought, but may not always  have been located. Such creation errors do not reflect on  the standard of medical care.

## 2021-04-14 ENCOUNTER — Encounter: Payer: Self-pay | Admitting: Internal Medicine

## 2021-04-14 ENCOUNTER — Other Ambulatory Visit: Payer: Self-pay

## 2021-04-14 ENCOUNTER — Ambulatory Visit: Payer: BC Managed Care – PPO | Admitting: Internal Medicine

## 2021-04-14 VITALS — BP 124/78 | HR 76 | Resp 15 | Ht 63.0 in | Wt 204.0 lb

## 2021-04-14 DIAGNOSIS — Z79899 Other long term (current) drug therapy: Secondary | ICD-10-CM

## 2021-04-14 DIAGNOSIS — M4690 Unspecified inflammatory spondylopathy, site unspecified: Secondary | ICD-10-CM | POA: Diagnosis not present

## 2021-04-14 DIAGNOSIS — E7800 Pure hypercholesterolemia, unspecified: Secondary | ICD-10-CM | POA: Insufficient documentation

## 2021-04-14 DIAGNOSIS — M25512 Pain in left shoulder: Secondary | ICD-10-CM

## 2021-04-14 DIAGNOSIS — K219 Gastro-esophageal reflux disease without esophagitis: Secondary | ICD-10-CM | POA: Insufficient documentation

## 2021-04-14 DIAGNOSIS — G8929 Other chronic pain: Secondary | ICD-10-CM

## 2021-04-14 DIAGNOSIS — N393 Stress incontinence (female) (male): Secondary | ICD-10-CM | POA: Insufficient documentation

## 2021-04-14 DIAGNOSIS — Z780 Asymptomatic menopausal state: Secondary | ICD-10-CM | POA: Insufficient documentation

## 2021-04-15 LAB — CBC WITH DIFFERENTIAL/PLATELET
Absolute Monocytes: 466 cells/uL (ref 200–950)
Basophils Absolute: 48 cells/uL (ref 0–200)
Basophils Relative: 0.5 %
Eosinophils Absolute: 390 cells/uL (ref 15–500)
Eosinophils Relative: 4.1 %
HCT: 46.7 % — ABNORMAL HIGH (ref 35.0–45.0)
Hemoglobin: 15.6 g/dL — ABNORMAL HIGH (ref 11.7–15.5)
Lymphs Abs: 4237 cells/uL — ABNORMAL HIGH (ref 850–3900)
MCH: 28.9 pg (ref 27.0–33.0)
MCHC: 33.4 g/dL (ref 32.0–36.0)
MCV: 86.6 fL (ref 80.0–100.0)
MPV: 9.5 fL (ref 7.5–12.5)
Monocytes Relative: 4.9 %
Neutro Abs: 4361 cells/uL (ref 1500–7800)
Neutrophils Relative %: 45.9 %
Platelets: 383 10*3/uL (ref 140–400)
RBC: 5.39 10*6/uL — ABNORMAL HIGH (ref 3.80–5.10)
RDW: 13.7 % (ref 11.0–15.0)
Total Lymphocyte: 44.6 %
WBC: 9.5 10*3/uL (ref 3.8–10.8)

## 2021-04-15 LAB — COMPLETE METABOLIC PANEL WITH GFR
AG Ratio: 1.7 (calc) (ref 1.0–2.5)
ALT: 23 U/L (ref 6–29)
AST: 16 U/L (ref 10–35)
Albumin: 4.8 g/dL (ref 3.6–5.1)
Alkaline phosphatase (APISO): 83 U/L (ref 31–125)
BUN: 14 mg/dL (ref 7–25)
CO2: 26 mmol/L (ref 20–32)
Calcium: 9.9 mg/dL (ref 8.6–10.2)
Chloride: 102 mmol/L (ref 98–110)
Creat: 0.77 mg/dL (ref 0.50–0.99)
Globulin: 2.9 g/dL (calc) (ref 1.9–3.7)
Glucose, Bld: 94 mg/dL (ref 65–99)
Potassium: 4.4 mmol/L (ref 3.5–5.3)
Sodium: 138 mmol/L (ref 135–146)
Total Bilirubin: 0.7 mg/dL (ref 0.2–1.2)
Total Protein: 7.7 g/dL (ref 6.1–8.1)
eGFR: 95 mL/min/{1.73_m2} (ref >=60)

## 2021-04-15 LAB — SEDIMENTATION RATE: Sed Rate: 9 mm/h (ref 0–20)

## 2021-04-15 NOTE — Progress Notes (Signed)
Lab test look fine for continuing the Enbrel. Hemoglobin is very slightly high. This is not a medication effect, does she have any sleep apnea problems that can sometimes be related?

## 2021-05-13 ENCOUNTER — Other Ambulatory Visit: Payer: Self-pay | Admitting: Internal Medicine

## 2021-05-13 DIAGNOSIS — M4690 Unspecified inflammatory spondylopathy, site unspecified: Secondary | ICD-10-CM

## 2021-05-16 ENCOUNTER — Other Ambulatory Visit (HOSPITAL_COMMUNITY): Payer: Self-pay

## 2021-05-16 ENCOUNTER — Telehealth: Payer: Self-pay | Admitting: Pharmacist

## 2021-05-16 DIAGNOSIS — M4690 Unspecified inflammatory spondylopathy, site unspecified: Secondary | ICD-10-CM

## 2021-05-16 NOTE — Telephone Encounter (Signed)
Do we know if this is the case? Previous documentation indicates uninsured through sometime in October so started through patient assistance but I think her status should be changed by now.

## 2021-05-16 NOTE — Telephone Encounter (Signed)
Please start Enbrel BIV (Enbrel Mini) throuhg her new [employer] plan  She started therapy on 02/24/21 and was uninsured shortly thereafter (switched to patient assistance). She is now insured again and will need Enbrel to go through insurance.  She should already have Enbrel savings card   Chesley Mires, PharmD, MPH, BCPS Clinical Pharmacist (Rheumatology and Pulmonology)

## 2021-05-16 NOTE — Telephone Encounter (Signed)
Received notification from [facility] regarding a prior authorization for ENBREL. Authorization has been APPROVED from 05/16/2021 to 05/16/2022. Approval letter sent to scan center.   Patient must fill through [facility]: [phone] (specifically lists phone# [phone])  Authorization # [id number]

## 2021-05-16 NOTE — Telephone Encounter (Signed)
Submitted a Prior Authorization request to [facility] for ENBREL via CoverMyMeds. Will update once we receive a response.   Key: [id number]

## 2021-05-16 NOTE — Telephone Encounter (Signed)
Next Visit: 07/14/2021  Last Visit: 04/14/2021  Last Fill: 02/24/2021  ZR:AQTMA spondyloarthritis   Current Dose per office note 04/14/2021: Enbrel 50 mg subcu weekly   Labs: 04/14/2021 Lab test look fine for continuing the Enbrel. Hemoglobin is very slightly high.  TB Gold: 12/28/2020   Okay to refill Enbrel?

## 2021-05-16 NOTE — Telephone Encounter (Signed)
Pharmacy team will have to run prior authorization through her new insurance. If patient is out of medication, can get one last supply from [facility] until coverage through insurance is in place  Chesley Mires, PharmD, MPH, BCPS Clinical Pharmacist (Rheumatology and Pulmonology)

## 2021-05-17 MED ORDER — ENBREL MINI 50 MG/ML ~~LOC~~ SOCT: 50.0000 mg | SUBCUTANEOUS | 0 refills | Status: DC

## 2021-05-17 NOTE — Telephone Encounter (Signed)
Rx for Enbrel sent to [facility]. Called patient to discuss. She states that she may have misplaced her debit card savings card for Enbrel. Advised her to look for this card first prior to calling [facility] so she can provide payment using this card.  Advised that if she is unable to find debit card, then she must call [phone] to request replacement. She verbalized understanding. She still had three cartridges of Enbrel remaining at home but will plan to start working on finding the savings card and getting set up with [facility]  Chesley Mires, PharmD, MPH, BCPS Clinical Pharmacist (Rheumatology and Pulmonology)

## 2021-07-01 ENCOUNTER — Ambulatory Visit: Payer: BC Managed Care – PPO | Admitting: Sports Medicine

## 2021-07-01 ENCOUNTER — Other Ambulatory Visit: Payer: Self-pay

## 2021-07-01 DIAGNOSIS — G8929 Other chronic pain: Secondary | ICD-10-CM | POA: Diagnosis not present

## 2021-07-01 DIAGNOSIS — M5030 Other cervical disc degeneration, unspecified cervical region: Secondary | ICD-10-CM | POA: Diagnosis not present

## 2021-07-01 DIAGNOSIS — M25512 Pain in left shoulder: Secondary | ICD-10-CM | POA: Diagnosis not present

## 2021-07-01 DIAGNOSIS — M67471 Ganglion, right ankle and foot: Secondary | ICD-10-CM | POA: Insufficient documentation

## 2021-07-01 NOTE — Assessment & Plan Note (Signed)
This is a pleasant 51 year old female, known cervical DDD C5-C7 with moderate canal stenosis, left-sided C7 radiculitis, at this point she is failed conservative measures including conditioning, medications including Lyrica. We will proceed now with a left C6-C7 interlaminar epidural with a 2 to 4-week follow-up.

## 2021-07-01 NOTE — Progress Notes (Signed)
° ° °  Procedures performed today:    None.  Independent interpretation of notes and tests performed by another provider:   MRI personally reviewed with the patient, there is mild C5-C7 DDD with mild central canal stenosis.  Brief History, Exam, Impression, and Recommendations:    DDD (degenerative disc disease), cervical This is a pleasant 51 year old female, known cervical DDD C5-C7 with moderate canal stenosis, left-sided C7 radiculitis, at this point she is failed conservative measures including conditioning, medications including Lyrica. We will proceed now with a left C6-C7 interlaminar epidural with a 2 to 4-week follow-up.  Chronic left shoulder pain Cuff tendinitis and subacromial bursitis on MRI, last injected in the summertime, did well for a while but having recurrence of pain left shoulder worse with abduction. We can revisit this after her epidural and consider repeat subacromial injection. If insufficient improvement for at least 3 to 6 months we will then we will refer for subacromial decompression.    ___________________________________________ Gwen Her. Dianah Field, M.D., ABFM., CAQSM. Primary Care and [place] Instructor of [employer] [facility] [employer]

## 2021-07-01 NOTE — Assessment & Plan Note (Signed)
Cuff tendinitis and subacromial bursitis on MRI, last injected in the summertime, did well for a while but having recurrence of pain left shoulder worse with abduction. We can revisit this after her epidural and consider repeat subacromial injection. If insufficient improvement for at least 3 to 6 months we will then we will refer for subacromial decompression.

## 2021-07-05 ENCOUNTER — Other Ambulatory Visit: Payer: BC Managed Care – PPO

## 2021-07-05 ENCOUNTER — Ambulatory Visit
Admission: RE | Admit: 2021-07-05 | Discharge: 2021-07-05 | Disposition: A | Discharge status: Home / Self Care | Payer: BC Managed Care – PPO | Source: Ambulatory Visit | Attending: Sports Medicine | Admitting: Sports Medicine

## 2021-07-05 DIAGNOSIS — M5030 Other cervical disc degeneration, unspecified cervical region: Secondary | ICD-10-CM

## 2021-07-05 DIAGNOSIS — M4722 Other spondylosis with radiculopathy, cervical region: Secondary | ICD-10-CM | POA: Diagnosis not present

## 2021-07-05 HISTORY — PX: OTHER SURGICAL HISTORY: SHX169

## 2021-07-05 IMAGING — XA DG INJECT/[PERSON_NAME] INC NEEDLE/CATH/PLC EPI/CERV/THOR W/IMG
2 series · 2 of 2 positions shown · non-contrast
Comparison: none

CLINICAL DATA: Spondylosis without myelopathy. Neck pain with
radiation to the left arm and hand with finger numbness.

[Series 1: ortho standard · 1 of 1 slices shown (1 of 2)]
[im 1/1]
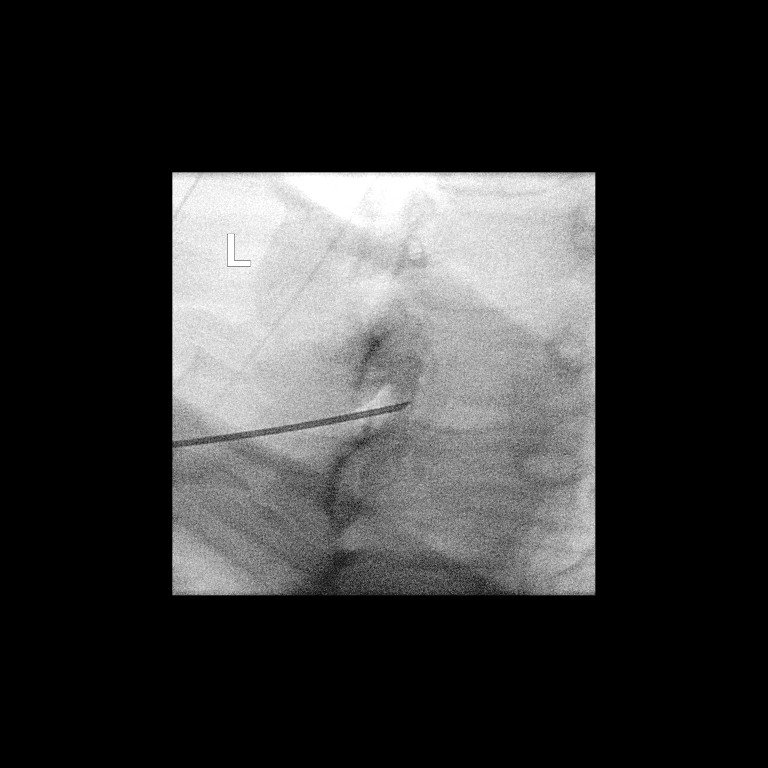

[Series 2: ortho standard · 1 of 1 slices shown (2 of 2)]
[im 1/1]
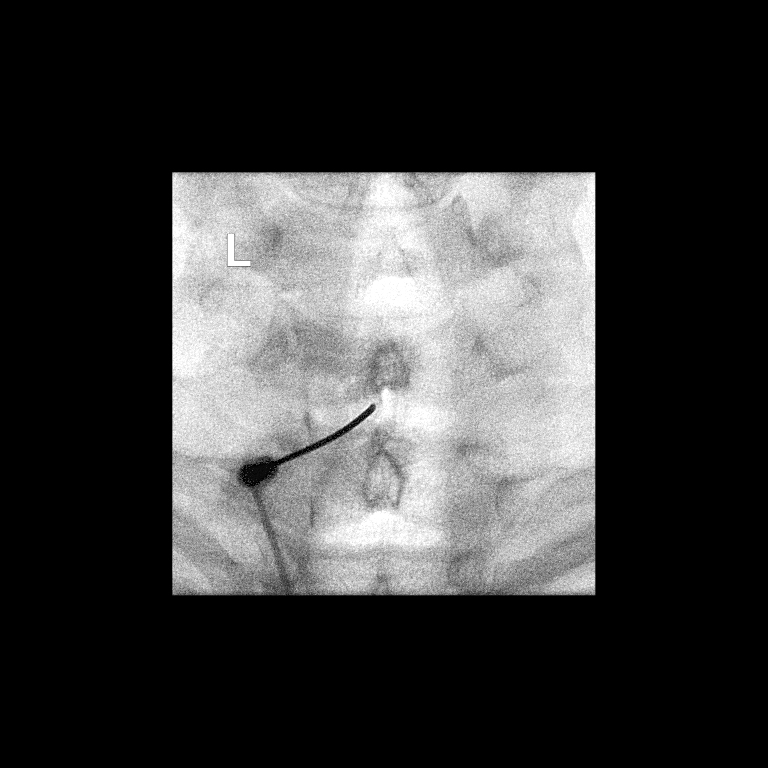

[2 of 2 positions shown; findings below may reference images not displayed]

FLUOROSCOPY TIME:  0 minutes 24 seconds. 6.93 micro gray meter
squared

PROCEDURE:
CERVICAL EPIDURAL INJECTION

An interlaminar approach was performed on the left at C6-7. A 20
gauge epidural needle was advanced using loss-of-resistance
technique.

DIAGNOSTIC EPIDURAL INJECTION

Injection of Isovue-M 300 shows a good epidural pattern with spread
above and below the level of needle placement, primarily on the
left. No vascular opacification is seen. THERAPEUTIC

EPIDURAL INJECTION

1.5 ml of Kenalog 40 mixed with 1 ml of 1% Lidocaine and 2 ml of
normal saline were then instilled. The procedure was well-tolerated,
and the patient was discharged thirty minutes following the
injection in good condition.
IMPRESSION: Technically successful initial epidural injection on the left at
C7.

## 2021-07-05 MED ORDER — IOPAMIDOL (ISOVUE-M 300) INJECTION 61%
1.0000 mL | Freq: Once | INTRAMUSCULAR | Status: AC | PRN
  Administered 2021-07-05: 1 mL via EPIDURAL

## 2021-07-05 MED ORDER — TRIAMCINOLONE ACETONIDE 40 MG/ML IJ SUSP (RADIOLOGY)
60.0000 mg | Freq: Once | INTRAMUSCULAR | Status: AC
  Administered 2021-07-05: 60 mg via EPIDURAL

## 2021-07-05 MED ORDER — DIAZEPAM 5 MG PO TABS
5.0000 mg | ORAL_TABLET | Freq: Once | ORAL | Status: AC
  Administered 2021-07-05: 5 mg via ORAL

## 2021-07-05 NOTE — Discharge Instructions (Signed)

## 2021-07-11 ENCOUNTER — Other Ambulatory Visit: Payer: BC Managed Care – PPO

## 2021-07-13 NOTE — Progress Notes (Signed)
Office Visit Note  Patient: Breanna [name]             Date of Birth: [birth year]           MRN: [id number]             PCP: Samuel Bouche, NP Referring: Samuel Bouche, NP Visit Date: 07/14/2021   Subjective:   History of Present Illness: Breanna [name] is a 51 y.o. 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 At last visit she had not yet seen a major change in symptoms with this treatment.  She feels symptoms are partially but noticeably improved since starting the Enbrel.  She describes nighttime awakenings from about 4 times to 3 times per night.  Her morning stiffness is decreased to about 10 minutes duration.  Low back pain is overall decreased in severity but especially the pain after waking and stationary positions.  She had a recent neck epidural treatment that helped a lot with neck pain though she still notices some stiffness to range of motion and crepitus.  Previous HPI 04/14/21 Breanna [name] is a 51 y.o. 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 She has not noticed any large change in symptoms after starting this medication. Still has morning stiffness and low back pain, some hand pain, and some night time awakenings. She reports increased left shoulder pain especially with raising her arm overhead more since the past 2-3 weeks. She had joint injection for this in July with a good improvement until now. She also tried increasing the lyrica medication for joint pain and some cervical spine degenerative changes with possible compression, but stopped this due to feeling poorly on this and worse at higher dose.   Previous HPI 12/28/20 Breanna [name] is a 51 y.o. female here for axial and peripheral spondyloarthritis. She previously saw a rheumatologist in [place] and took Humira for this but has been off treatment for at least a year. Review of previous rheumatology records indicates most recent treatment was on  methotrexate and humira with reasonably good disease control. Apparently GI intolerance of methotrexate with titration to 25 mg PO weekly but tolerated at lower doses and labs monitoring were okay. She previously had joint involvement with pain in the lower thoracic spine, lumbar spine, right SI joint, and bilateral heels. She has previously reported joint pain dating back at least 20 years in duration. She moved from [place] due to work so has been off treatment since that time. She has noticed some increase in joint pain and stiffness but worst symptom increase is fatigue. She felt Humira was controlled symptoms well initially with loss of improvement, and addition of methotrexate did not improve much. She currently has neck and back pain mostly, at night and in the morning but also increase in neck pain during the day working. She recently underwent left shoulder injection  a month ago with improvement in pain there. Otherwise she has not taken any steroid medication recently.   Outside labs reviewed HLA-B27 pos   DMARD Hx Humira 2020-? MTX 2020-? NSAIDs - inadequate response   Review of Systems  Constitutional:  Positive for fatigue.  HENT:  Negative for mouth sores, mouth dryness and nose dryness.   Eyes:  Positive for itching and dryness. Negative for pain.  Respiratory:  Negative for shortness of breath and difficulty breathing.   Cardiovascular:  Negative for chest pain and palpitations.  Gastrointestinal:  Negative for blood in  stool, constipation and diarrhea.  Endocrine: Negative for increased urination.  Genitourinary:  Negative for difficulty urinating.  Musculoskeletal:  Positive for joint pain, joint pain, joint swelling, myalgias, morning stiffness, muscle tenderness and myalgias.  Skin:  Negative for color change, rash and redness.  Allergic/Immunologic: Negative for susceptible to infections.  Neurological:  Positive for headaches. Negative for dizziness, numbness, memory  loss and weakness.  Hematological:  Negative for bruising/bleeding tendency.  Psychiatric/Behavioral:  Negative for confusion.    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[place])    Anxiety     Past Medical History:  Diagnosis Date   Ankylosing spondylitis (HCC)    Anxiety    Arthritis    Asthma    GERD (gastroesophageal reflux disease)    Hiatal hernia    Hyperlipidemia    Menopause    Shingles     Family History  Problem Relation Age of Onset   Hypertension Mother    Heart attack Father    Healthy Brother    HIV Son    Lupus Cousin    Rheum arthritis Cousin    Past Surgical History:  Procedure Laterality Date   ABDOMINAL HYSTERECTOMY     BREAST SURGERY     CARPAL TUNNEL RELEASE     epidural steroid injection  07/05/2021   in neck   [place] History   Social History Narrative   Not on file   Immunization History  Administered Date(s) Administered   Influenza-Unspecified 03/05/2020   Moderna Sars-Covid-2 Vaccination 06/12/2019, 08/22/2019, 03/30/2020   Tdap 05/05/2013     Objective: Vital Signs: BP (!) 145/86 (BP Location: Right Arm, Patient Position: Sitting, Cuff Size: Large)    Pulse 64    Ht _0  (1.6 m)    Wt 205 lb (93 kg)    BMI 36.31 kg/m    Physical Exam Eyes:     Conjunctiva/sclera: Conjunctivae normal.  Cardiovascular:     Rate and Rhythm: Normal rate  and regular rhythm.  Pulmonary:     Effort: Pulmonary effort is normal.     Breath sounds: Normal breath sounds.  Skin:    General: Skin is warm and dry.     Comments: Mild erythema and very dry skin on fingers of both hands no lesions or swelling  Psychiatric:        Mood and Affect: Mood normal.     Musculoskeletal Exam:  Neck full ROM no tenderness Shoulders full ROM no tenderness or swelling Elbows full ROM no tenderness or swelling Wrists full ROM no tenderness or swelling Fingers full ROM no tenderness or swelling Low back tenderness to pressure, tenderness over upper right SI joint with pressure and with posterior thrust pressure Knees full ROM no tenderness or swelling Ankles full ROM no tenderness or swelling   Investigation: No additional findings.  Imaging: Fransico Michael DIAG/THERA/INC NEEDLE/CATH/PLC EPI/CERV/THOR W/IMG  Result Date: 07/05/2021  CLINICAL DATA:  Spondylosis without myelopathy. Neck pain with radiation to the left arm and hand with finger numbness. FLUOROSCOPY TIME:  0 minutes 24 seconds. 6.93 micro gray meter squared PROCEDURE: CERVICAL EPIDURAL INJECTION An interlaminar approach was performed on the left at C6-7. A 20 gauge epidural needle was advanced using loss-of-resistance technique. DIAGNOSTIC EPIDURAL INJECTION Injection of Isovue-M 300 shows a good epidural pattern with spread above and below the level of needle placement, primarily on the left. No vascular opacification is seen. THERAPEUTIC EPIDURAL INJECTION 1.5 ml of Kenalog 40 mixed with 1 ml of 1% Lidocaine and 2 ml of normal saline were then instilled. The procedure was well-tolerated, and the patient was discharged thirty minutes following the injection in good condition. IMPRESSION: Technically successful initial epidural injection on the left at C7. Electronically Signed   By: Nelson Chimes M.D.   On: 07/05/2021 13:04    Recent Labs: Lab Results  Component Value Date   WBC 12.8 (H) 07/14/2021    HGB 14.8 07/14/2021   PLT 394 07/14/2021   NA 139 07/14/2021   K 4.5 07/14/2021   CL 102 07/14/2021   CO2 30 07/14/2021   GLUCOSE 102 (H) 07/14/2021   BUN 18 07/14/2021   CREATININE 0.80 07/14/2021   BILITOT 0.8 07/14/2021   ALKPHOS 91 07/01/2020   AST 13 07/14/2021   ALT 27 07/14/2021   PROT 7.3 07/14/2021   ALBUMIN 4.6 07/01/2020   CALCIUM 10.0 07/14/2021   GFRAA 87 09/16/2020   QFTBGOLDPLUS NEGATIVE 12/28/2020    Speciality Comments: No specialty comments available.  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  Biggest exam abnormality around right SI joint consistent with radiographic changes.  We discussed possibly trying different medications versus keeping Enbrel which is so far very well-tolerated and having a noticeable improvement.  Plan to continue the Enbrel 50 mg subcu weekly.  High risk medication use - Plan: CBC with Differential/Platelet, COMPLETE METABOLIC PANEL WITH GFR  Checking CBC and CMP today for continued use of Enbrel medication monitoring.  DDD (degenerative disc disease), cervical  Symptoms improved currently after recent local epidural treatment. Has ongoing follow up for this.  Orders: Orders Placed This Encounter  Procedures   CBC with Differential/Platelet   COMPLETE METABOLIC PANEL WITH GFR   No orders of the defined types were placed in this encounter.    Follow-Up Instructions: Return in about 3 months (around 10/11/2021) for AxSpA on ENB f/u 37mo.   CCollier Salina MD  Note - This record has been created using [employer]  Chart creation errors have been sought, but may not always  have been located. Such creation errors do not reflect on  the standard of medical care.

## 2021-07-14 ENCOUNTER — Ambulatory Visit: Payer: BC Managed Care – PPO | Admitting: Internal Medicine

## 2021-07-14 ENCOUNTER — Encounter: Payer: Self-pay | Admitting: Internal Medicine

## 2021-07-14 ENCOUNTER — Other Ambulatory Visit: Payer: Self-pay

## 2021-07-14 VITALS — BP 145/86 | HR 64 | Ht 63.0 in | Wt 205.0 lb

## 2021-07-14 DIAGNOSIS — M4690 Unspecified inflammatory spondylopathy, site unspecified: Secondary | ICD-10-CM | POA: Diagnosis not present

## 2021-07-14 DIAGNOSIS — Z79899 Other long term (current) drug therapy: Secondary | ICD-10-CM | POA: Diagnosis not present

## 2021-07-14 DIAGNOSIS — M5030 Other cervical disc degeneration, unspecified cervical region: Secondary | ICD-10-CM | POA: Diagnosis not present

## 2021-07-14 LAB — CBC WITH DIFFERENTIAL/PLATELET
Absolute Monocytes: 768 cells/uL (ref 200–950)
Basophils Absolute: 38 cells/uL (ref 0–200)
Basophils Relative: 0.3 %
Eosinophils Absolute: 141 cells/uL (ref 15–500)
Eosinophils Relative: 1.1 %
HCT: 44.7 % (ref 35.0–45.0)
Hemoglobin: 14.8 g/dL (ref 11.7–15.5)
Lymphs Abs: 5082 cells/uL — ABNORMAL HIGH (ref 850–3900)
MCH: 29 pg (ref 27.0–33.0)
MCHC: 33.1 g/dL (ref 32.0–36.0)
MCV: 87.6 fL (ref 80.0–100.0)
MPV: 9.3 fL (ref 7.5–12.5)
Monocytes Relative: 6 %
Neutro Abs: 6771 cells/uL (ref 1500–7800)
Neutrophils Relative %: 52.9 %
Platelets: 394 10*3/uL (ref 140–400)
RBC: 5.1 10*6/uL (ref 3.80–5.10)
RDW: 14 % (ref 11.0–15.0)
Total Lymphocyte: 39.7 %
WBC: 12.8 10*3/uL — ABNORMAL HIGH (ref 3.8–10.8)

## 2021-07-14 LAB — COMPLETE METABOLIC PANEL WITH GFR
AG Ratio: 1.8 (calc) (ref 1.0–2.5)
ALT: 27 U/L (ref 6–29)
AST: 13 U/L (ref 10–35)
Albumin: 4.7 g/dL (ref 3.6–5.1)
Alkaline phosphatase (APISO): 72 U/L (ref 37–153)
BUN: 18 mg/dL (ref 7–25)
CO2: 30 mmol/L (ref 20–32)
Calcium: 10 mg/dL (ref 8.6–10.4)
Chloride: 102 mmol/L (ref 98–110)
Creat: 0.8 mg/dL (ref 0.50–1.03)
Globulin: 2.6 g/dL (calc) (ref 1.9–3.7)
Glucose, Bld: 102 mg/dL — ABNORMAL HIGH (ref 65–99)
Potassium: 4.5 mmol/L (ref 3.5–5.3)
Sodium: 139 mmol/L (ref 135–146)
Total Bilirubin: 0.8 mg/dL (ref 0.2–1.2)
Total Protein: 7.3 g/dL (ref 6.1–8.1)
eGFR: 90 mL/min/{1.73_m2} (ref >=60)

## 2021-07-15 ENCOUNTER — Ambulatory Visit: Payer: BC Managed Care – PPO | Admitting: Sports Medicine

## 2021-07-15 DIAGNOSIS — G8929 Other chronic pain: Secondary | ICD-10-CM

## 2021-07-15 DIAGNOSIS — M25512 Pain in left shoulder: Secondary | ICD-10-CM

## 2021-07-15 DIAGNOSIS — M5030 Other cervical disc degeneration, unspecified cervical region: Secondary | ICD-10-CM | POA: Diagnosis not present

## 2021-07-15 NOTE — Progress Notes (Signed)
° ° °  Procedures performed today:    None.  Independent interpretation of notes and tests performed by another provider:   None.  Brief History, Exam, Impression, and Recommendations:    DDD (degenerative disc disease), cervical Pleasant 51 year old female, known cervical DDD from C5-C7 with moderate central canal stenosis and left C7 distribution radiculitis, failed conservative treatments we proceeded with a left-sided cervical epidural, she did extremely well, she still has some mechanical symptoms but really no pain. Return as needed.  Chronic left shoulder pain Rotator cuff tendinitis and subacromial bursitis on MRI, impingement signs on exam. This was last injected in the summertime of 2022 when she did really well, she has had additional improvement after her cervical epidural recently, at this point she does not need any additional subacromial injections. She continues to work with her rheumatologist, and is currently on Enbrel. Return as needed for this.    ___________________________________________ Gwen Her. Dianah Field, M.D., ABFM., CAQSM. Primary Care and [place] Instructor of [employer] [facility]

## 2021-07-15 NOTE — Assessment & Plan Note (Signed)
Pleasant 51 year old female, known cervical DDD from C5-C7 with moderate central canal stenosis and left C7 distribution radiculitis, failed conservative treatments we proceeded with a left-sided cervical epidural, she did extremely well, she still has some mechanical symptoms but really no pain. Return as needed.

## 2021-07-15 NOTE — Progress Notes (Signed)
Labs look good no new changes needed.

## 2021-07-15 NOTE — Assessment & Plan Note (Signed)
Rotator cuff tendinitis and subacromial bursitis on MRI, impingement signs on exam. This was last injected in the summertime of 2022 when she did really well, she has had additional improvement after her cervical epidural recently, at this point she does not need any additional subacromial injections. She continues to work with her rheumatologist, and is currently on Enbrel. Return as needed for this.

## 2021-07-26 ENCOUNTER — Other Ambulatory Visit: Payer: Self-pay | Admitting: Internal Medicine

## 2021-07-26 DIAGNOSIS — M4690 Unspecified inflammatory spondylopathy, site unspecified: Secondary | ICD-10-CM

## 2021-07-31 NOTE — Progress Notes (Signed)
°  HPI with pertinent ROS:   CC: mood, HLD, weight follow up  HPI: Pleasant 51 year old female presenting today for the following:  Mood-has been very stressed lately. Her house is being sold by the owners and she is working 2 jobs. Some other family stress as well. Has been picking at her fingers again. Taking Amitriptyline 20-30mg  nightly depending on if she needs to work the next day. 30mg  leaves her groggy but helps with sleep. 20mg  does not cause grogginess but doesn't help with sleep.   Weight gain- is working on exercise and dietary modification but does not feel that there is any progress being made. Interested in other options to augment her weight loss.   HLD- taking Rosuvastatin as prescribed, tolerating well without side effects. Due for lipid check.   I reviewed the past medical history, family history, social history, surgical history, and allergies today and no changes were needed.  Please see the problem list section below in epic for further details.   Physical exam:   General: Well Developed, well nourished, and in no acute distress.  Neuro: Alert and oriented x3.  HEENT: Normocephalic, atraumatic.  Skin: Warm and dry. Cardiac: Regular rate and rhythm, no murmurs rubs or gallops, no lower extremity edema.  Respiratory: Clear to auscultation bilaterally. Not using accessory muscles, speaking in full sentences.  Impression and Recommendations:    1. Mixed hyperlipidemia Checking lipids. Continue Rosuvastatin 20mg  daily.  - Lipid panel  2. Anxiety Discontinue Amitriptyline due to being ineffective/side effects. Starting Cymbalta 30mg  daily for 1-2 weeks and if tolerating, increase to 60mg  daily. Recommend nighttime dosing as this may cause drowsiness to help with insomnia. May benefit generalized myalgias/arthralgias.   3. Encounter for weight management Reviewed weight loss options. She would like to try Ozempic to see if her insurance will cover it since she has  recently changed policies. Referring to [facility].  - Amb Ref to Medical Weight Management  4. Needs flu shot Flu vaccine given in office today.  - Flu Vaccine QUAD 6+ mos PF IM (Fluarix Quad PF)  5. Encounter for screening mammogram for malignant neoplasm of breast Mammogram ordered.  - MM 3D SCREEN BREAST BILATERAL; Future  6. Well woman exam Referring to OB/GYN per patient request.  - Ambulatory referral to Obstetrics / Gynecology  Return in about 6 weeks (around 09/12/2021) for mood follow up. ___________________________________________ Clearnce Sorrel, DNP, APRN, FNP-BC Primary Care and [place]

## 2021-08-01 ENCOUNTER — Encounter: Payer: Self-pay | Admitting: Medical-Surgical

## 2021-08-01 ENCOUNTER — Other Ambulatory Visit: Payer: Self-pay

## 2021-08-01 ENCOUNTER — Ambulatory Visit: Payer: BC Managed Care – PPO | Admitting: Medical-Surgical

## 2021-08-01 VITALS — BP 137/83 | HR 79 | Resp 20 | Ht 63.0 in | Wt 202.5 lb

## 2021-08-01 DIAGNOSIS — F419 Anxiety disorder, unspecified: Secondary | ICD-10-CM

## 2021-08-01 DIAGNOSIS — E782 Mixed hyperlipidemia: Secondary | ICD-10-CM | POA: Diagnosis not present

## 2021-08-01 DIAGNOSIS — Z23 Encounter for immunization: Secondary | ICD-10-CM | POA: Diagnosis not present

## 2021-08-01 DIAGNOSIS — Z01419 Encounter for gynecological examination (general) (routine) without abnormal findings: Secondary | ICD-10-CM | POA: Diagnosis not present

## 2021-08-01 DIAGNOSIS — Z1231 Encounter for screening mammogram for malignant neoplasm of breast: Secondary | ICD-10-CM

## 2021-08-01 DIAGNOSIS — Z7689 Persons encountering health services in other specified circumstances: Secondary | ICD-10-CM

## 2021-08-01 MED ORDER — OZEMPIC (0.25 OR 0.5 MG/DOSE) 2 MG/1.5ML ~~LOC~~ SOPN: 0.2500 mg | PEN_INJECTOR | SUBCUTANEOUS | 0 refills | Status: DC

## 2021-08-01 MED ORDER — DULOXETINE HCL 30 MG PO CPEP: 30.0000 mg | ORAL_CAPSULE | Freq: Every day | ORAL | 1 refills | Status: DC

## 2021-08-01 NOTE — Patient Instructions (Signed)
Duloxetine Delayed-Release Capsules What is this medication? DULOXETINE (doo LOX e teen) treats depression, anxiety, fibromyalgia, and certain types of chronic pain such as nerve, bone, or joint pain. It increases the amount of serotonin and norepinephrine in the brain, hormones that help regulate mood and pain. It belongs to a group of medications called SNRIs. This medicine may be used for other purposes; ask your health care provider or pharmacist if you have questions. COMMON BRAND NAME(S): Cymbalta, Drizalma, Irenka What should I tell my care team before I take this medication? They need to know if you have any of these conditions: Bipolar disorder Glaucoma High blood pressure Kidney disease Liver disease Seizures Suicidal thoughts, plans or attempt; a previous suicide attempt by you or a family member Take medications that treat or prevent blood clots Taken medications called MAOIs like Carbex, Eldepryl, Marplan, Nardil, and Parnate within 14 days Trouble passing urine An unusual reaction to duloxetine, other medications, foods, dyes, or preservatives Pregnant or trying to get pregnant Breast-feeding How should I use this medication? Take this medication by mouth with a glass of water. Follow the directions on the prescription label. Do not crush, cut or chew some capsules of this medication. Some capsules may be opened and sprinkled on applesauce. Check with your care team or pharmacist if you are not sure. You can take this medication with or without food. Take your medication at regular intervals. Do not take your medication more often than directed. Do not stop taking this medication suddenly except upon the advice of your care team. Stopping this medication too quickly may cause serious side effects or your condition may worsen. A special MedGuide will be given to you by the pharmacist with each prescription and refill. Be sure to read this information carefully each time. Talk to  your care team regarding the use of this medication in children. While this medication may be prescribed for children as young as 7 years of age for selected conditions, precautions do apply. Overdosage: If you think you have taken too much of this medicine contact a poison control center or emergency room at once. NOTE: This medicine is only for you. Do not share this medicine with others. What if I miss a dose? If you miss a dose, take it as soon as you can. If it is almost time for your next dose, take only that dose. Do not take double or extra doses. What may interact with this medication? Do not take this medication with any of the following: Desvenlafaxine Levomilnacipran Linezolid MAOIs like Carbex, Eldepryl, Emsam, Marplan, Nardil, and Parnate Methylene blue (injected into a vein) Milnacipran Safinamide Thioridazine Venlafaxine Viloxazine This medication may also interact with the following: Alcohol Amphetamines Aspirin and aspirin-like medications Certain antibiotics like ciprofloxacin and enoxacin Certain medications for blood pressure, heart disease, irregular heart beat Certain medications for depression, anxiety, or psychotic disturbances Certain medications for migraine headache like almotriptan, eletriptan, frovatriptan, naratriptan, rizatriptan, sumatriptan, zolmitriptan Certain medications that treat or prevent blood clots like warfarin, enoxaparin, and dalteparin Cimetidine Fentanyl Lithium NSAIDS, medications for pain and inflammation, like ibuprofen or naproxen Phentermine Procarbazine Rasagiline Sibutramine St. John's wort Theophylline Tramadol Tryptophan This list may not describe all possible interactions. Give your health care provider a list of all the medicines, herbs, non-prescription drugs, or dietary supplements you use. Also tell them if you smoke, drink alcohol, or use illegal drugs. Some items may interact with your medicine. What should I watch  for while using this medication? Tell your   care team if your symptoms do not get better or if they get worse. Visit your care team for regular checks on your progress. Because it may take several weeks to see the full effects of this medication, it is important to continue your treatment as prescribed by your care team. This medication may cause serious skin reactions. They can happen weeks to months after starting the medication. Contact your care team right away if you notice fevers or flu-like symptoms with a rash. The rash may be red or purple and then turn into blisters or peeling of the skin. Or, you might notice a red rash with swelling of the face, lips, or lymph nodes in your neck or under your arms. Watch for new or worsening thoughts of suicide or depression. This includes sudden changes in mood, behaviors, or thoughts. These changes can happen at any time but are more common in the beginning of treatment or after a change in dose. Call your care team right away if you experience these thoughts or worsening depression. Manic episodes may happen in patients with bipolar disorder who take this medication. Watch for changes in feelings or behaviors such as feeling anxious, nervous, agitated, panicky, irritable, hostile, aggressive, impulsive, severely restless, overly excited and hyperactive, or trouble sleeping. These symptoms can happen at any time, but are more common in the beginning of treatment or after a change in dose. Call your care team right away if you notice any of these symptoms. You may get drowsy or dizzy. Do not drive, use machinery, or do anything that needs mental alertness until you know how this medication affects you. Do not stand or sit up quickly, especially if you are an older patient. This reduces the risk of dizzy or fainting spells. Alcohol may interfere with the effect of this medication. Avoid alcoholic drinks. This medication may increase blood sugar. The risk may be  higher in patients who already have diabetes. Ask your care team what you can do to lower your risk of diabetes while taking this medication. This medication can cause an increase in blood pressure. This medication can also cause a sudden drop in your blood pressure, which may make you feel faint and increase the chance of a fall. These effects are most common when you first start the medication or when the dose is increased, or during use of other medications that can cause a sudden drop in blood pressure. Check with your care team for instructions on monitoring your blood pressure while taking this medication. Your mouth may get dry. Chewing sugarless gum or sucking hard candy, and drinking plenty of water, may help. Contact your care team if the problem does not go away or is severe. What side effects may I notice from receiving this medication? Side effects that you should report to your care team as soon as possible: Allergic reactions--skin rash, itching, hives, swelling of the face, lips, tongue, or throat Bleeding--bloody or black, tar-like stools, red or dark brown urine, vomiting blood or brown material that looks like coffee grounds, small, red or purple spots on skin, unusual bleeding or bruising Increase in blood pressure Liver injury--right upper belly pain, loss of appetite, nausea, light-colored stool, dark yellow or brown urine, yellowing skin or eyes, unusual weakness or fatigue Low sodium level--muscle weakness, fatigue, dizziness, headache, confusion Redness, blistering, peeling, or loosening of the skin, including inside the mouth Serotonin syndrome--irritability, confusion, fast or irregular heartbeat, muscle stiffness, twitching muscles, sweating, high fever, seizures, chills, vomiting, diarrhea   Sudden eye pain or change in vision such as blurry vision, seeing halos around lights, vision loss Thoughts of suicide or self-harm, worsening mood, feelings of depression Trouble passing  urine Side effects that usually do not require medical attention (report to your care team if they continue or are bothersome): Change in sex drive or performance Constipation Diarrhea Dizziness Dry mouth Excessive sweating Loss of appetite Nausea Vomiting This list may not describe all possible side effects. Call your doctor for medical advice about side effects. You may report side effects to FDA at 1-800-FDA-1088. Where should I keep my medication? Keep out of the reach of children and pets. Store at room temperature between 15 and 30 degrees C (59 to 86 degrees F). Get rid of any unused medication after the expiration date. To get rid of medications that are no longer needed or have expired: Take the medication to a medication take-back program. Check with your pharmacy or law enforcement to find a location. If you cannot return the medication, check the label or package insert to see if the medication should be thrown out in the garbage or flushed down the toilet. If you are not sure, ask your care team. If it is safe to put it in the trash, take the medication out of the container. Mix the medication with cat litter, dirt, coffee grounds, or other unwanted substance. Seal the mixture in a bag or container. Put it in the trash. NOTE: This sheet is a summary. It may not cover all possible information. If you have questions about this medicine, talk to your doctor, pharmacist, or health care provider.  2022 Elsevier/Gold Standard (2020-06-01 00:00:00)  

## 2021-08-02 LAB — LIPID PANEL
Cholesterol: 219 mg/dL — ABNORMAL HIGH (ref <200)
HDL: 57 mg/dL (ref >=50)
LDL Cholesterol (Calc): 132 mg/dL (calc) — ABNORMAL HIGH
Non-HDL Cholesterol (Calc): 162 mg/dL (calc) — ABNORMAL HIGH (ref <130)
Total CHOL/HDL Ratio: 3.8 (calc) (ref <5.0)
Triglycerides: 167 mg/dL — ABNORMAL HIGH (ref <150)

## 2021-08-02 MED ORDER — ROSUVASTATIN CALCIUM 40 MG PO TABS: 40.0000 mg | ORAL_TABLET | Freq: Every day | ORAL | 3 refills | Status: DC

## 2021-08-02 NOTE — Addendum Note (Signed)
Addended byChristen Butter on: 08/02/2021 07:20 AM   Modules accepted: Orders

## 2021-08-03 ENCOUNTER — Encounter: Payer: Self-pay | Admitting: Medical-Surgical

## 2021-08-10 ENCOUNTER — Encounter: Payer: Self-pay | Admitting: Medical-Surgical

## 2021-08-17 ENCOUNTER — Telehealth: Payer: Self-pay

## 2021-08-17 NOTE — Telephone Encounter (Signed)
Patient's insurance called regarding PA for patient's Ozempic. Insurance unable to accept electronic PA for [employer]. PA will have to be completed by phone. Phone number for PA is [phone]. Forward to [place].  ?

## 2021-08-21 NOTE — Progress Notes (Signed)
? ?ANNUAL EXAM ?Patient name: Breanna [name] MRN [id number]  Date of birth: 04-Dec-1970 ?Chief Complaint:   ?Annual Exam ? ?History of Present Illness:   ?Breanna [name] is a 51 y.o. G1P1 female being seen today for a routine annual exam.  ? ?Current complaints: hot flashes/night sweats and poor sleep since her she had ovaries removed as well. She also notes SUI prior to hyst but worse after. She has not yet done pt.  ? ?No LMP recorded. Patient has had a hysterectomy. ?- She had a TLH/BSO d/t dysmenorrhea and essure coils.  ? ?Last pap She has a hysterectomy. She has no h/o of abnormal pap  ?Health Maintenance Due  ?Topic Date Due  ? Zoster Vaccines- Shingrix (1 of 2) Never done  ? COLONOSCOPY (Pts 45-76yrs Insurance coverage will need to be confirmed)  Never done  ? PAP SMEAR-Modifier  03/05/2020  ? COVID-19 Vaccine (4 - Booster for Moderna series) 05/25/2020  ? MAMMOGRAM  Never done  ?- MXR scheduled for 09/08/21 ? ? ? ?  09/22/2020  ?  8:52 AM 09/22/2020  ?  8:41 AM 08/25/2020  ?  9:53 AM 07/28/2020  ?  8:28 AM  ?Depression screen PHQ 2/9  ?Decreased Interest 1 1 1 1   ?Down, Depressed, Hopeless 0 0 0 1  ?PHQ - 2 Score 1 1 1 2   ?Altered sleeping 1  2 3   ?Tired, decreased energy 2  3 2   ?Change in appetite 0  0 0  ?Feeling bad or failure about yourself  0  0 1  ?Trouble concentrating 1  1 2   ?Moving slowly or fidgety/restless 0  0 0  ?Suicidal thoughts 0  0 0  ?PHQ-9 Score 5  7 10   ?Difficult doing work/chores Somewhat difficult  Somewhat difficult Somewhat difficult  ? ?  ? ?  09/22/2020  ?  8:52 AM 08/25/2020  ?  9:53 AM 07/28/2020  ?  8:29 AM  ?GAD 7 : Generalized Anxiety Score  ?Nervous, Anxious, on Edge 1 2 2   ?Control/stop worrying 0 1 2  ?Worry too much - different things 1 2 2   ?Trouble relaxing 1 2 3   ?Restless 0 1 0  ?Easily annoyed or irritable 1 2 3   ?Afraid - awful might happen 0 0 1  ?Total GAD 7 Score 4 10 13   ?Anxiety Difficulty Somewhat difficult Somewhat difficult Somewhat difficult  ? ? ? ?Review of  Systems:   ?Pertinent items are noted in HPI ?Denies any headaches, blurred vision, fatigue, shortness of breath, chest pain, abdominal pain, abnormal vaginal discharge/itching/odor/irritation, problems with periods, bowel movements, urination, or intercourse unless otherwise stated above.  ?Pertinent History Reviewed:  ?Reviewed past medical,surgical, social and family history.  ?Reviewed problem list, medications and allergies. ?Physical Assessment:  ? ?Vitals:  ? 08/25/21 1019  ?BP: (!) 148/84  ?Pulse: 82  ?Weight: 204 lb (92.5 kg)  ?Height: 5\' 3"  (1.6 m)  ?Body mass index is 36.14 kg/m?. ?  ?Physical Examination:  ?General appearance - well appearing, and in no distress ?Mental status - alert, oriented to person, place, and time ?Psych:  She has a normal mood and affect ?Skin - warm and dry, normal color, no suspicious lesions noted ?Chest - effort normal, all lung fields clear to auscultation bilaterally ?Heart - normal rate and regular rhythm ?Neck:  midline trachea, no thyromegaly or nodules ?Breasts - breasts appear normal, no suspicious masses, no skin or nipple changes or  axillary nodes ?Abdomen - soft, nontender, nondistended, no  masses or organomegaly ?Pelvic -  ?VULVA: normal appearing vulva with no masses, tenderness or lesions   ?VAGINA: normal appearing vagina with normal color and discharge, no lesions, lower pelvic muscles weak with kegel ?CERVIX: surgically absent ?UTERUS: surgically absent ?ADNEXA: No adnexal masses or tenderness noted. ?Extremities:  No swelling or varicosities noted ? ?Chaperone present for exam ? ?No results found for this or any previous visit (from the past 24 hour(s)).  ?Assessment & Plan:  ?Sueko was seen today for annual exam. ? ?Diagnoses and all orders for this visit: ? ?Encounter for annual routine gynecological examination ?- Cervical cancer screening: Discussed guidelines. Pap no longer indicated d/t hisotry of normal and hyst that included cervix.  ?- Breast  Health: Encouraged self breast awareness/SBE. Discussed limits of clinical breast exam for detecting breast cancer. Discussed importance of annual MXR.  Rx given ?- Climacteric/Sexual health: Reviewed typical and atypical symptoms of menopause/peri-menopause. Discussed PMB and to call if any amount of spotting.  ?- Bone Health: Calcium via diet and supplementation. Discussed weight bearing exercise. DEXA not yet due ?- Colonoscopy: plans to do through PCP - not yet arranged ?- F/U 12 months and prn ? ?Menopausal hot flushes ?- - We discussed the treatment options for menopause as well as indications - we discussed both HRT and non-HRT.  ?- Discussed the benefits of each and effectiveness.  ?- Discussed goals of therapy i.e. reduction of hot flashes (not complete resolution). Reviewed full response takes up to 6 weeks, including for hormone therapy. ?- Discussed if HRT we do shortest amount of time at lowest dose. We discussed annual attempts at coming of the HRT typically in the fall months when the weather has cooled down. ?- We discussed the differences in modes of therapy for HRT -- patch vs oral therapy.  ?- Discussed risks of HRT:  Discussed risk of E alone.   ?- Discussed genitourinary symptoms i.e. urinary issues, vaginal dryness and dyspareunia and that for these symptoms, local treatment is best. ?- She would like: HRT ? ?-     estradiol (CLIMARA - DOSED IN MG/24 HR) 0.05 mg/24hr patch; Place 1 patch (0.05 mg total) onto the skin once a week. ? ?SUI (stress urinary incontinence, female) ?- if not improved with pt, would send to urogyn if she would be interested in sling.  ?-     Ambulatory referral to Physical Therapy ? ? ? ? ?Orders Placed This Encounter  ?Procedures  ? Ambulatory referral to Physical Therapy  ? ? ?Meds:  ?Meds ordered this encounter  ?Medications  ? estradiol (CLIMARA - DOSED IN MG/24 HR) 0.05 mg/24hr patch  ?  Sig: Place 1 patch (0.05 mg total) onto the skin once a week.  ?  Dispense:  4  patch  ?  Refill:  12  ? ? ?Follow-up: No follow-ups on file. ? ?Milas Hock, MD ?08/25/2021 ?10:54 AM ?

## 2021-08-24 ENCOUNTER — Telehealth: Payer: Self-pay

## 2021-08-24 NOTE — Telephone Encounter (Addendum)
Initiated Prior authorization CBS:[id number] ?Via: [facility] ?[phone] ?TTSV:[id number] ?Status: approved  as of 08/24/21 ?Reason:08/24/21-08/24/24 ?Notified Pt via: Mychart ?

## 2021-08-25 ENCOUNTER — Ambulatory Visit (INDEPENDENT_AMBULATORY_CARE_PROVIDER_SITE_OTHER): Payer: BC Managed Care – PPO | Admitting: Obstetrics and Gynecology

## 2021-08-25 ENCOUNTER — Other Ambulatory Visit: Payer: Self-pay

## 2021-08-25 ENCOUNTER — Encounter: Payer: Self-pay | Admitting: Obstetrics and Gynecology

## 2021-08-25 VITALS — BP 148/84 | HR 82 | Ht 63.0 in | Wt 204.0 lb

## 2021-08-25 DIAGNOSIS — N951 Menopausal and female climacteric states: Secondary | ICD-10-CM

## 2021-08-25 DIAGNOSIS — N393 Stress incontinence (female) (male): Secondary | ICD-10-CM

## 2021-08-25 DIAGNOSIS — Z01419 Encounter for gynecological examination (general) (routine) without abnormal findings: Secondary | ICD-10-CM | POA: Diagnosis not present

## 2021-08-25 MED ORDER — ESTRADIOL 0.05 MG/24HR TD PTWK: 0.0500 mg | MEDICATED_PATCH | TRANSDERMAL | 12 refills | Status: DC

## 2021-09-08 ENCOUNTER — Ambulatory Visit: Payer: BC Managed Care – PPO

## 2021-09-12 NOTE — Progress Notes (Signed)
?HPI with pertinent ROS:  ? ?CC: mood follow up ? ?HPI: ?Pleasant 51 year old female presenting today for mood follow up after starting Cymbalta 30mg  daily about 6 weeks ago.  ? ?Mood concerns ?Anxiety: Yes ?Depression: Yes ?Contributing factors: moved into a smaller house over the past 2 weeks in the rain, mom now living with her and her husband ?Medications: Cymbalta 30mg  daily ?Compliant: Yes ?Meds helpful: was taking the edge off until the last couple of weeks ?Side effects: none ?Past medications: multiple medications tried ?Counseling: None, considering starting this summer ?SI/HI: Denies ? ?Weight concerns-started Ozempic 0.25 mg and did 1 injection.  Notes that she had some significant nausea that felt like carsickness.  Shortly after her first injection, they started they are moving process and she has not done another injection since then.  Plans to start over the weekend with the 0.25 mg dose but is worried about constipation as well as the nausea. ? ?I reviewed the past medical history, family history, social history, surgical history, and allergies today and no changes were needed.  Please see the problem list section below in epic for further details. ? ? ?  09/13/2021  ?  8:58 AM 09/22/2020  ?  8:52 AM 09/22/2020  ?  8:41 AM 08/25/2020  ?  9:53 AM 07/28/2020  ?  8:28 AM  ?Depression screen PHQ 2/9  ?Decreased Interest 2 1 1 1 1   ?Down, Depressed, Hopeless 1 0 0 0 1  ?PHQ - 2 Score 3 1 1 1 2   ?Altered sleeping 2 1  2 3   ?Tired, decreased energy 2 2  3 2   ?Change in appetite 1 0  0 0  ?Feeling bad or failure about yourself  1 0  0 1  ?Trouble concentrating 2 1  1 2   ?Moving slowly or fidgety/restless 2 0  0 0  ?Suicidal thoughts 0 0  0 0  ?PHQ-9 Score 13 5  7 10   ?Difficult doing work/chores Very difficult Somewhat difficult  Somewhat difficult Somewhat difficult  ? ? ?  09/13/2021  ?  8:59 AM 09/22/2020  ?  8:52 AM 08/25/2020  ?  9:53 AM 07/28/2020  ?  8:29 AM  ?GAD 7 : Generalized Anxiety Score  ?Nervous,  Anxious, on Edge 3 1 2 2   ?Control/stop worrying 2 0 1 2  ?Worry too much - different things 2 1 2 2   ?Trouble relaxing 3 1 2 3   ?Restless 2 0 1 0  ?Easily annoyed or irritable 3 1 2 3   ?Afraid - awful might happen 1 0 0 1  ?Total GAD 7 Score 16 4 10 13   ?Anxiety Difficulty Very difficult Somewhat difficult Somewhat difficult Somewhat difficult  ? ? ? ?Physical exam:  ? ?General: Well Developed, well nourished, and in no acute distress.  ?Neuro: Alert and oriented x3.  ?HEENT: Normocephalic, atraumatic.  ?Skin: Warm and dry. ?Cardiac: Regular rate and rhythm, no murmurs rubs or gallops, no lower extremity edema.  ?Respiratory: Clear to auscultation bilaterally. Not using accessory muscles, speaking in full sentences. ? ?Impression and Recommendations:   ? ?1. Anxiety ?Multiple factors affecting her mood management.  Increasing Cymbalta to 60 mg daily.  Okay to double up on 30 mg dose but new 60 mg dose sent to the pharmacy as well. ? ?2.  Encounter for weight management ?Resume Ozempic 0.25 mg for the next 5 weeks.  Reviewed recommendations for dietary modification while on this medication.  Sending in Zofran ODT 8 mg 3  times daily as needed for nausea management.  Recommend starting Colace 100 mg once daily to help prevent constipation while on this medication. ? ? ?Return in about 6 weeks (around 10/25/2021) for mood follow up. ?___________________________________________ ?Thayer Ohm, DNP, APRN, FNP-BC ?Primary Care and Sports Medicine ?[facility] ?

## 2021-09-13 ENCOUNTER — Encounter: Payer: Self-pay | Admitting: Medical-Surgical

## 2021-09-13 ENCOUNTER — Ambulatory Visit: Payer: BC Managed Care – PPO | Admitting: Medical-Surgical

## 2021-09-13 VITALS — BP 138/84 | HR 71 | Resp 20 | Ht 63.0 in | Wt 204.5 lb

## 2021-09-13 DIAGNOSIS — Z7689 Persons encountering health services in other specified circumstances: Secondary | ICD-10-CM | POA: Diagnosis not present

## 2021-09-13 DIAGNOSIS — F419 Anxiety disorder, unspecified: Secondary | ICD-10-CM | POA: Diagnosis not present

## 2021-09-13 MED ORDER — DULOXETINE HCL 60 MG PO CPEP: 60.0000 mg | ORAL_CAPSULE | Freq: Every day | ORAL | 1 refills | Status: DC

## 2021-09-13 MED ORDER — ONDANSETRON 8 MG PO TBDP: 8.0000 mg | ORAL_TABLET | Freq: Three times a day (TID) | ORAL | 3 refills | Status: DC | PRN

## 2021-10-06 NOTE — Progress Notes (Signed)
? ?Office Visit Note ? ?Patient: Breanna [name]             ?Date of Birth: 07-25-1970           ?MRN: [id number]             ?PCP: Samuel Bouche, NP ?Referring: Samuel Bouche, NP ?Visit Date: 10/12/2021 ? ? ?Subjective:  ?Follow-up (No improvement) ? ? ?History of Present Illness: Breanna [name] is a 51 y.o. 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 She has not seen much additional improvement staying on the Enbrel. Back stiffness is worst every morning. More recently having some left shoulder and right elbow pain also but these are only stiff for less than 30 minutes in mornings. She has increased in skin peeling on fingertips of both hands she thinks this is related to washing and soap and has had similar problems before. ? ?Previous HPI ?07/14/2021 ?Breanna [name] is a 51 y.o. 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 At last visit she had not yet seen a major change in symptoms with this treatment.  She feels symptoms are partially but noticeably improved since starting the Enbrel.  She describes nighttime awakenings from about 4 times to 3 times per night.  Her morning stiffness is decreased to about 10 minutes duration.  Low back pain is overall decreased in severity but especially the pain after waking and stationary positions.  She had a recent neck epidural treatment that helped a lot with neck pain though she still notices some stiffness to range of motion and crepitus. ?  ?Previous HPI ?04/14/21 ?Breanna [name] is a 51 y.o. 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 She has not noticed any large change in symptoms after starting this medication. Still has morning stiffness and low back pain, some hand pain, and some night time awakenings. She reports increased left shoulder pain especially with raising her arm overhead more since the past 2-3 weeks. She had joint injection for this in July  with a good improvement until now. She also tried increasing the lyrica medication for joint pain and some cervical spine degenerative changes with possible compression, but stopped this due to feeling poorly on this and worse at higher dose. ?  ?Previous HPI ?12/28/20 ?Breanna [name] is a 51 y.o. female here for axial and peripheral spondyloarthritis. She previously saw a rheumatologist in [place] and took Humira for this but has been off treatment for at least a year. Review of previous rheumatology records indicates most recent treatment was on methotrexate and humira with reasonably good disease control. Apparently GI intolerance of methotrexate with titration to 25 mg PO weekly but tolerated at lower doses and labs monitoring were okay. She previously had joint involvement with pain in the lower thoracic spine, lumbar spine, right SI joint, and bilateral heels. She has previously reported joint pain dating back at least 20 years in duration. ?She moved from [place] due to work so has been off treatment since that time. She has noticed some increase in joint pain and stiffness but worst symptom increase is fatigue. She felt Humira was controlled symptoms well initially with loss of improvement, and addition of methotrexate did not improve much. She currently has neck and back pain mostly, at night and in the morning but also increase in neck pain during the day working. She recently underwent left shoulder injection  a month ago with  improvement in pain there. Otherwise she has not taken any steroid medication recently. ?  ?Outside labs reviewed ?HLA-B27 pos ?  ?DMARD Hx ?Humira 2020-? ?MTX 2020-? ?NSAIDs - inadequate response ? ? ?Review of Systems  ?Constitutional:  Positive for fatigue.  ?HENT:  Positive for mouth dryness.   ?Eyes:  Positive for dryness.  ?Respiratory:  Negative for shortness of breath.   ?Cardiovascular:  Negative for swelling in legs/feet.  ?Gastrointestinal:  Positive for constipation.   ?Endocrine: Positive for heat intolerance.  ?Genitourinary:  Negative for difficulty urinating.  ?Musculoskeletal:  Positive for joint pain, joint pain, joint swelling and morning stiffness.  ?Skin:  Negative for rash.  ?Allergic/Immunologic: Negative for susceptible to infections.  ?Neurological:  Negative for numbness.  ?Hematological:  Negative for bruising/bleeding tendency.  ?Psychiatric/Behavioral:  Positive for sleep disturbance.   ? ?PMFS History:  ?Patient Active Problem List  ? Diagnosis Date Noted  ? Gastroesophageal reflux disease without esophagitis 04/14/2021  ? Menopause 04/14/2021  ? Morbid obesity ([place]) 04/14/2021  ? Pure hypercholesterolemia 04/14/2021  ? Stress incontinence (female) (female) 04/14/2021  ? High risk medication use 12/28/2020  ? Primary osteoarthritis of right knee 12/21/2020  ? Morton's neuroma, right 12/21/2020  ? Chronic left shoulder pain 11/23/2020  ? DDD (degenerative disc disease), cervical 11/23/2020  ? Obesity, Class II, BMI 35-39.9 07/28/2020  ? Hyperlipidemia   ? Asthma   ? Axial spondyloarthritis ([place])   ? Anxiety   ?  ?Past Medical History:  ?Diagnosis Date  ? Ankylosing spondylitis ([place])   ? Anxiety   ? Arthritis   ? Asthma   ? GERD (gastroesophageal reflux disease)   ? Hiatal hernia   ? Hyperlipidemia   ? Menopause   ? Shingles   ?  ?Family History  ?Problem Relation Age of Onset  ? Hypertension Mother   ? Heart attack Father   ? Healthy Brother   ? HIV Son   ? Lupus Cousin   ? Rheum arthritis Cousin   ? ?Past Surgical History:  ?Procedure Laterality Date  ? ABDOMINAL HYSTERECTOMY    ? BREAST SURGERY    ? CARPAL TUNNEL RELEASE    ? epidural steroid injection  07/05/2021  ? in neck  ? GALLBLADDER SURGERY    ? KNEE SURGERY    ? ?Social History  ? ?Social History Narrative  ? Not on file  ? ?Immunization History  ?Administered Date(s) Administered  ? Influenza,inj,Quad PF,6+ Mos 08/01/2021  ? Influenza-Unspecified 03/05/2020  ? Moderna Sars-Covid-2 Vaccination 06/12/2019,  08/22/2019, 03/30/2020  ? Tdap 05/05/2013  ?  ? ?Objective: ?Vital Signs: BP (!) 159/81 (BP Location: Left Arm, Patient Position: Sitting, Cuff Size: Normal)   Pulse 85   Resp 15   Ht $R'5\' 3"'Kp$  (1.6 m)   Wt 198 lb (89.8 kg)   BMI 35.07 kg/m?   ? ?Physical Exam ?Constitutional:   ?   Appearance: She is obese.  ?Cardiovascular:  ?   Rate and Rhythm: Normal rate and regular rhythm.  ?Pulmonary:  ?   Effort: Pulmonary effort is normal.  ?   Breath sounds: Normal breath sounds.  ?Skin: ?   General: Skin is warm and dry.  ?   Comments: Skin peeling on distal portion of fingers no focal lesion or ulcers, no nail changes  ?Neurological:  ?   Mental Status: She is alert.  ?Psychiatric:     ?   Mood and Affect: Mood normal.  ?  ? ?Musculoskeletal Exam:  ?Shoulders full ROM  but with significant stiffness, mild tenderness to pressure ?Right elbow pain worst around medial epicondyle, no palpable swelling ?Wrists full ROM no tenderness or swelling ?Fingers full ROM no tenderness or swelling ?Lateral hip okay and no pain with rotation, right low back and SI joint region tenderness to pressure ?Knees full ROM no tenderness or swelling ?Ankles full ROM no tenderness or swelling ? ? ?Investigation: ?No additional findings. ? ?Imaging: ?No results found. ? ?Recent Labs: ?Lab Results  ?Component Value Date  ? WBC 12.8 (H) 07/14/2021  ? HGB 14.8 07/14/2021  ? PLT 394 07/14/2021  ? NA 139 07/14/2021  ? K 4.5 07/14/2021  ? CL 102 07/14/2021  ? CO2 30 07/14/2021  ? GLUCOSE 102 (H) 07/14/2021  ? BUN 18 07/14/2021  ? CREATININE 0.80 07/14/2021  ? BILITOT 0.8 07/14/2021  ? ALKPHOS 91 07/01/2020  ? AST 13 07/14/2021  ? ALT 27 07/14/2021  ? PROT 7.3 07/14/2021  ? ALBUMIN 4.6 07/01/2020  ? CALCIUM 10.0 07/14/2021  ? GFRAA 87 09/16/2020  ? QFTBGOLDPLUS NEGATIVE 12/28/2020  ? ? ?Speciality Comments: No specialty comments available. ? ?Procedures:  ?No procedures performed ?Allergies: Lexapro [escitalopram], Lipitor [atorvastatin], Meloxicam,  Phendimetrazine tartrate, and Aspirin  ? ?Assessment / Plan:     ?Visit Diagnoses: Axial spondyloarthritis ([place]) ? ?Symptoms appear stable but not really improving in a large way with the continued Enbrel 50 mg [place] we

## 2021-10-12 ENCOUNTER — Ambulatory Visit: Payer: BC Managed Care – PPO | Admitting: Internal Medicine

## 2021-10-12 ENCOUNTER — Encounter: Payer: Self-pay | Admitting: Internal Medicine

## 2021-10-12 ENCOUNTER — Telehealth: Payer: Self-pay | Admitting: Pharmacist

## 2021-10-12 VITALS — BP 159/81 | HR 85 | Resp 15 | Ht 63.0 in | Wt 198.0 lb

## 2021-10-12 DIAGNOSIS — Z79899 Other long term (current) drug therapy: Secondary | ICD-10-CM

## 2021-10-12 DIAGNOSIS — M4690 Unspecified inflammatory spondylopathy, site unspecified: Secondary | ICD-10-CM | POA: Diagnosis not present

## 2021-10-12 DIAGNOSIS — E7800 Pure hypercholesterolemia, unspecified: Secondary | ICD-10-CM

## 2021-10-12 DIAGNOSIS — M5030 Other cervical disc degeneration, unspecified cervical region: Secondary | ICD-10-CM

## 2021-10-12 NOTE — Telephone Encounter (Signed)
Please start Rinvoq BIV. ? ?Dose: 15mg  daily ? ?Dx: axial spondyloarthritis (M46.9) ? ?Previously tried therapies: ?Enbrel - inadequate clinical response ?Humira - inadequate clinical response ? ?Pending OV note to be signed and updated labs from 10/12/21 ? ?12/12/21, PharmD, MPH, BCPS, CPP ?Clinical Pharmacist (Rheumatology and Pulmonology) ?

## 2021-10-12 NOTE — Patient Instructions (Signed)
Upadacitinib Extended-Release Tablets What is this medication? UPADACITINIB (ue PAD a SYE ti nib) treats autoimmune conditions, such as arthritis, eczema, and ulcerative colitis. It is prescribed when other medications have not worked or cannot be tolerated. It works by slowing down an overactive immune system. This decreases inflammation. This medicine may be used for other purposes; ask your health care provider or pharmacist if you have questions. COMMON BRAND NAME(S): RINVOQ What should I tell my care team before I take this medication? They need to know if you have any of these conditions: Blood clots Cancer Diabetes (high blood sugar) Heart disease High blood pressure High cholesterol Immune system problems Infection, especially a viral infection such as chickenpox, cold sores, or herpes Infection such as tuberculosis (TB) or other bacterial, fungal or viral infection Kidney disease Liver disease Low blood counts (white cells, platelets, or red blood cells) Lung or breathing disease (asthma, COPD) Organ transplant Recent or upcoming vaccine Skin cancer/melanoma Smoke tobacco cigarettes Stomach or intestine problems Stroke An unusual or allergic reaction to upadacitinib, other medications, foods, dyes or preservatives Pregnant or trying to get pregnant Breast-feeding How should I use this medication? Take this medication by mouth with water. Take it as directed on the prescription label at the same time every day. Do not cut, crush, or chew this medication. Swallow the tablets whole. You can take it with or without food. If it upsets your stomach, take it with food. Keep taking it unless your care team tells you to stop. A special MedGuide will be given to you by the pharmacist with each prescription and refill. Be sure to read this information carefully each time. Talk to your care team about the use of this medication in children. While this medication may be prescribed for  children as young as 12 years for selected conditions, precautions do apply. Overdosage: If you think you have taken too much of this medicine contact a poison control center or emergency room at once. NOTE: This medicine is only for you. Do not share this medicine with others. What if I miss a dose? If you miss a dose, take it as soon as you can. If it is almost time for your next dose, take only that dose. Do not take double or extra doses. What may interact with this medication? Do not take this medication with any of the following: Baricitinib Tofacitinib This medication may also interact with the following: Azathioprine Certain antivirals for hepatitis or HIV Biologic medications, such as abatacept, adalimumab, anakinra, certolizumab, etanercept, golimumab, infliximab, rituximab, secukinumab, tocilizumab, ustekinumab Certain medications for fungal infections, such as ketoconazole, itraconazole, posaconazole, or voriconazole Certain medications for seizures, such as carbamazepine, phenobarbital, phenytoin Clarithromycin Cyclosporine Live vaccines Medications that lower your chance of fighting infection Mifepristone Nefazodone Rifampin Supplements, such as St. John's wort This list may not describe all possible interactions. Give your health care provider a list of all the medicines, herbs, non-prescription drugs, or dietary supplements you use. Also tell them if you smoke, drink alcohol, or use illegal drugs. Some items may interact with your medicine. What should I watch for while using this medication? Visit your care team for regular checks on your progress. Tell your care team if your symptoms do not start to get better or if they get worse. You may need blood work done while you are taking this medication. Avoid taking medications that contain aspirin, acetaminophen, ibuprofen, naproxen, or ketoprofen unless instructed by your care team. These medications may hide a fever. This    medication may increase your risk of getting an infection. Call your care team for advice if you get a fever, chills, sore throat, or other symptoms of a cold or flu. Do not treat yourself. Try to avoid being around people who are sick. Do not become pregnant while taking this medication. Women should inform their care team if they wish to become pregnant or think they might be pregnant. Women should use a form of birth control while taking this medication. Women will also need to take it for 4 weeks after stopping this medication. There is potential for serious harm to an unborn child. Talk to your care team for more information. Do not breast-feed an infant while taking this medication or for 6 days after stopping it. Talk to your care team about your risk of cancer. You may be more at risk for certain types of cancer if you take this medication. Talk to your care team about your risk of skin cancer. You may be more at risk for skin cancer if you take this medication. This medication can make you more sensitive to the sun. Keep out of the sun. If you cannot avoid being in the sun, wear protective clothing and sunscreen. Do not use sun lamps or tanning beds/booths. Tell your care team right away if you have any change in your eyesight. What side effects may I notice from receiving this medication? Side effects that you should report to your care team as soon as possible: Allergic reactions--skin rash, itching, hives, swelling of the face, lips, tongue, or throat Blood clot--pain, swelling, or warmth in the leg, shortness of breath, chest pain Change in vision Heart attack--pain or tightness in the chest, shoulders, arms, or jaw, nausea, shortness of breath, cold or clammy skin, feeling faint or lightheaded Infection--fever, chills, cough, sore throat, wounds that don't heal, pain or trouble when passing urine, general feeling of discomfort or being unwell Liver injury--right upper belly pain, loss of  appetite, nausea, light-colored stool, dark yellow or brown urine, yellowing skin or eyes, unusual weakness or fatigue Low red blood cell level--unusual weakness or fatigue, dizziness, headache, trouble breathing Sudden or severe stomach pain, bloody diarrhea, fever, nausea, vomiting Stroke--sudden numbness or weakness of the face, arm, or leg, trouble speaking, confusion, trouble walking, loss of balance or coordination, dizziness, severe headache, change in vision Side effects that usually do not require medical attention (report to your care team if they continue or are bothersome): Acne Cough Headache Nausea Runny or stuffy nose This list may not describe all possible side effects. Call your doctor for medical advice about side effects. You may report side effects to FDA at 1-800-FDA-1088. Where should I keep my medication? Keep out of the reach of children and pets. Store at room temperature between 20 and 25 degrees C (68 and 77 degrees F). Get rid of any unused medication after the expiration date. To get rid of medications that are no longer needed or have expired: Take the medication to a medication take-back program. Check with your pharmacy or law enforcement to find a location. If you cannot return the medication, check the label or package insert to see if the medication should be thrown out in the garbage or flushed down the toilet. If you are not sure, ask your care team. If it is safe to put it in the trash, empty the medication out of the container. Mix the medication with cat litter, dirt, coffee grounds, or other unwanted substance. Seal the mixture   in a bag or container. Put it in the trash. NOTE: This sheet is a summary. It may not cover all possible information. If you have questions about this medicine, talk to your doctor, pharmacist, or health care provider.  2023 Elsevier/Gold Standard (2021-05-13 00:00:00)  

## 2021-10-13 ENCOUNTER — Other Ambulatory Visit (HOSPITAL_COMMUNITY): Payer: Self-pay

## 2021-10-13 ENCOUNTER — Ambulatory Visit (INDEPENDENT_AMBULATORY_CARE_PROVIDER_SITE_OTHER): Payer: BC Managed Care – PPO

## 2021-10-13 DIAGNOSIS — Z1231 Encounter for screening mammogram for malignant neoplasm of breast: Secondary | ICD-10-CM | POA: Diagnosis not present

## 2021-10-13 IMAGING — MG MM DIGITAL SCREENING BILAT W/ TOMO AND CAD
6 of 10 series · 6 of 30 positions shown · non-contrast
Comparison: Previous exam(s).

CLINICAL DATA: Screening.

EXAM:
DIGITAL SCREENING BILATERAL MAMMOGRAM WITH TOMOSYNTHESIS AND CAD
TECHNIQUE: Bilateral screening digital craniocaudal and mediolateral oblique
mammograms were obtained. Bilateral screening digital breast
tomosynthesis was performed. The images were evaluated with
computer-aided detection.

[L MLO synth-2D]
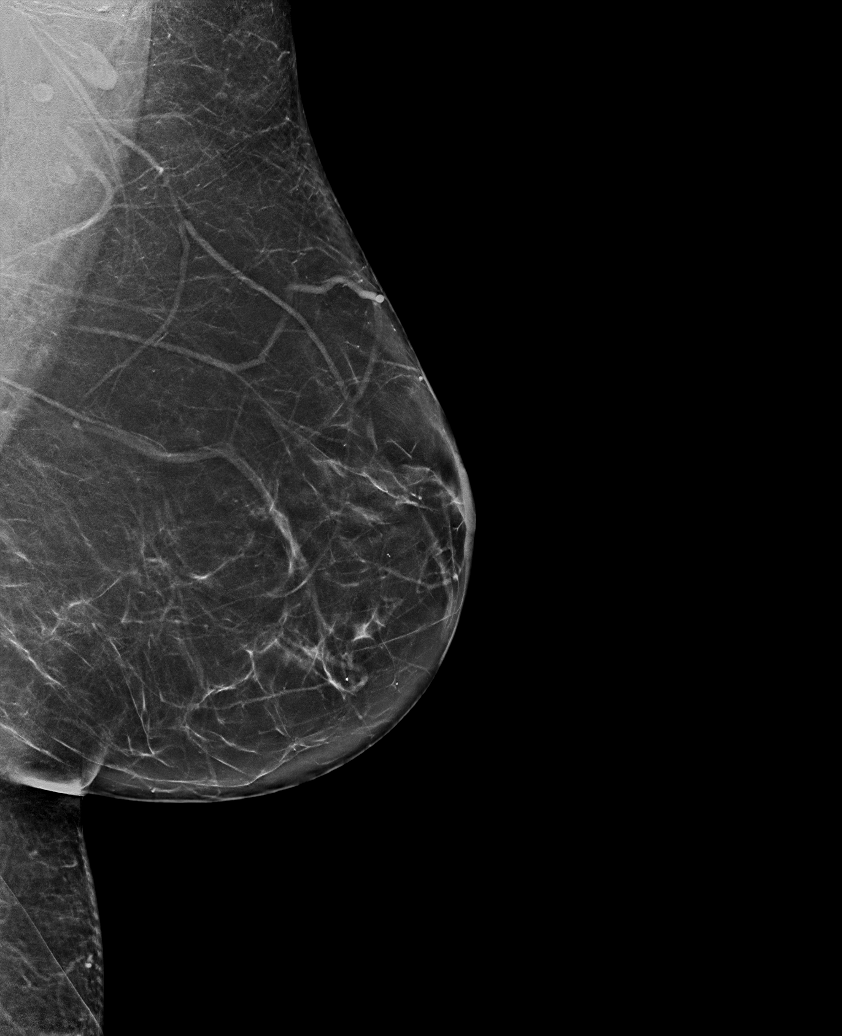

[L CC synth-2D]
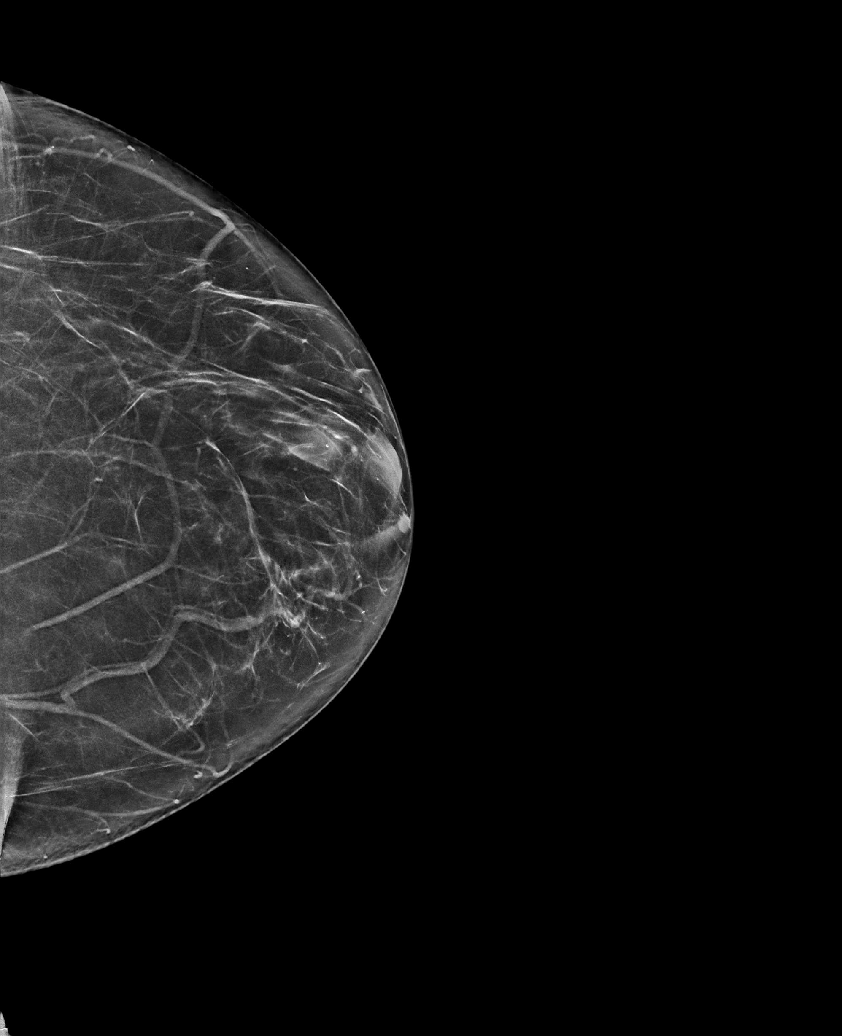

[R CC synth-2D]
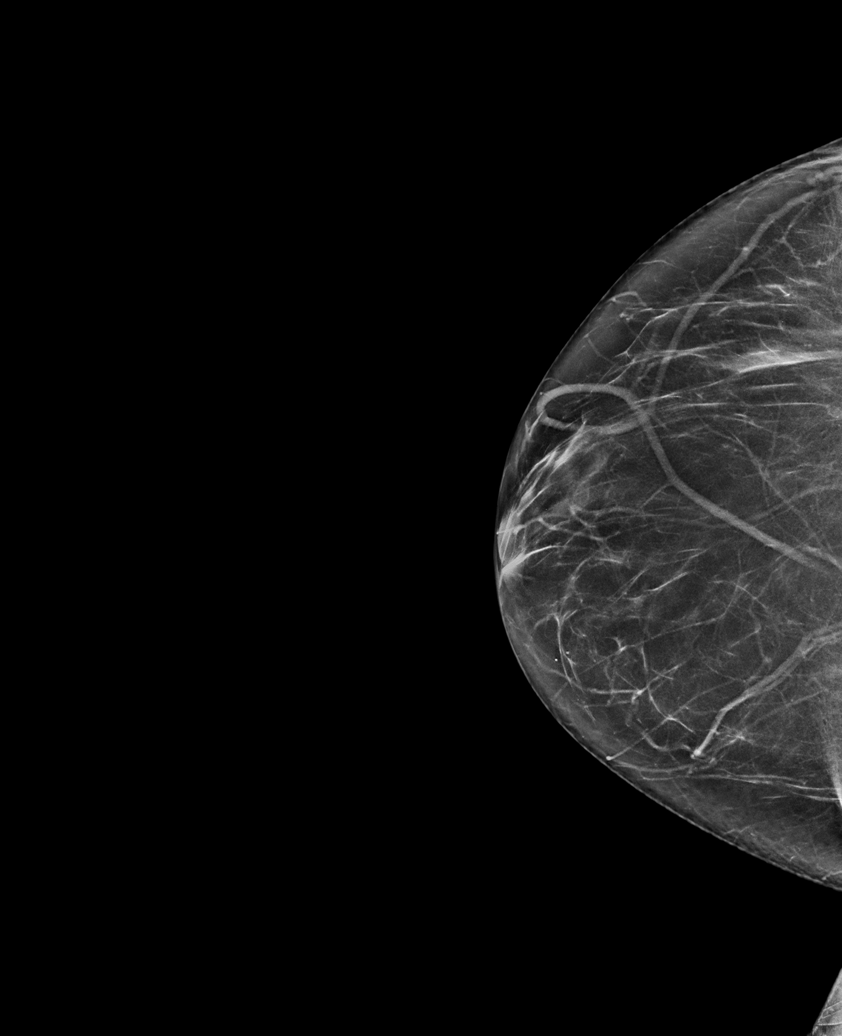

[R MLO synth-2D (1 of 2)]
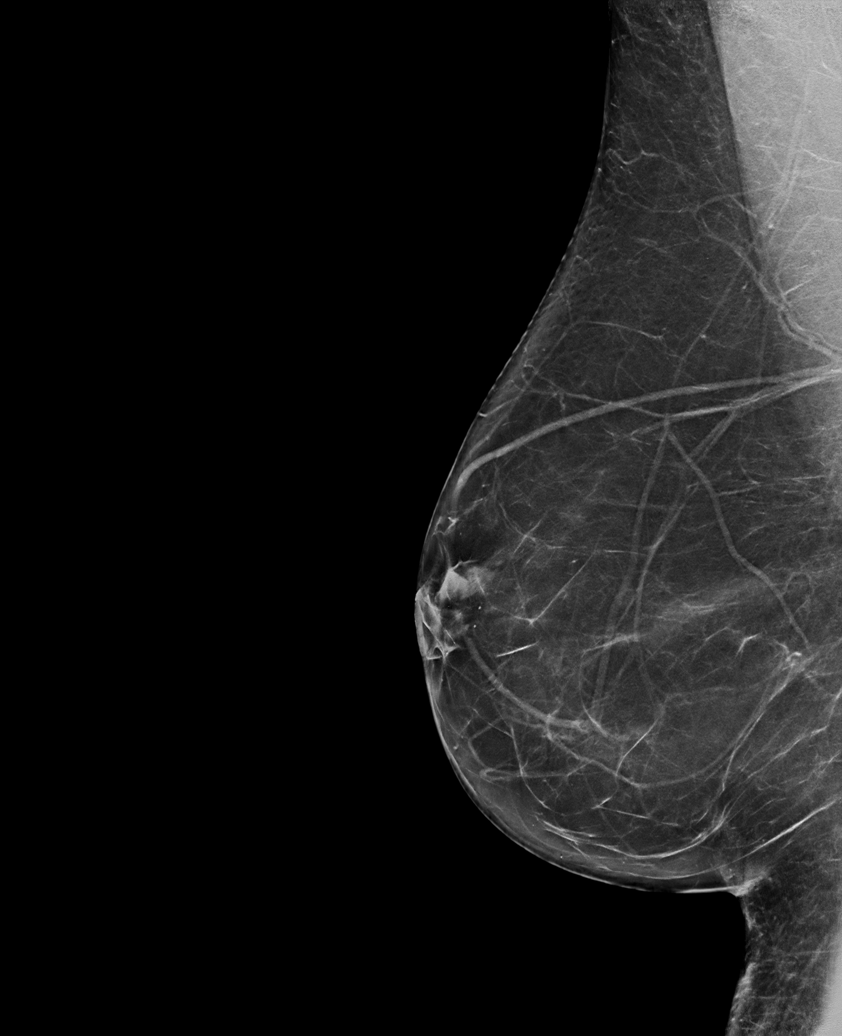

[R MLO synth-2D (2 of 2)]
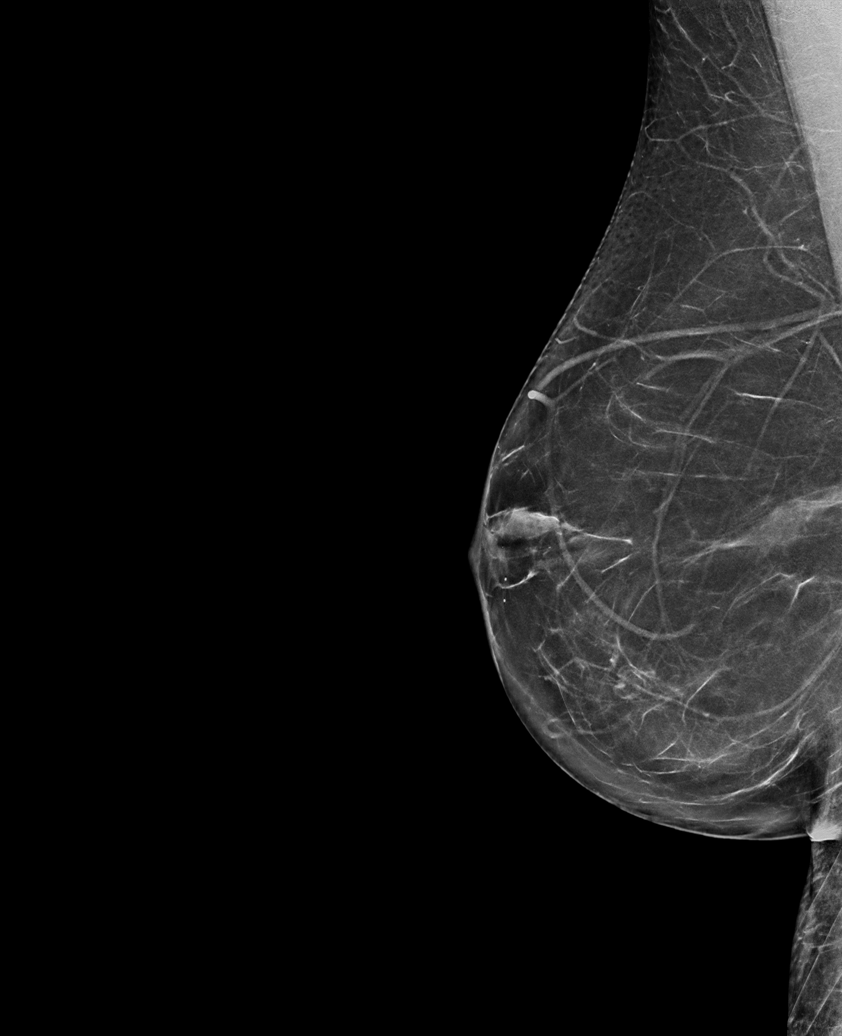

[L MLO tomo · tomo slice 38/75.0]
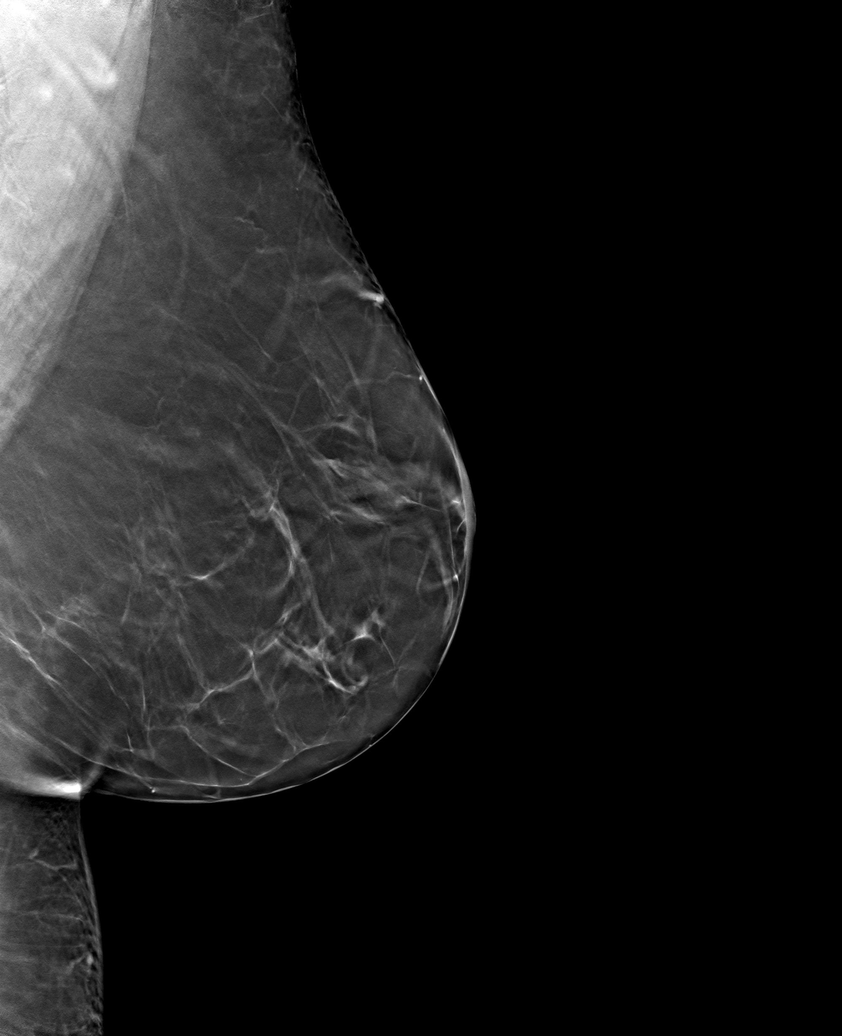

[6 of 30 positions shown; findings below may reference images not displayed]

ACR Breast Density Category b: There are scattered areas of
fibroglandular density.
FINDINGS: There are no findings suspicious for malignancy.
IMPRESSION: No mammographic evidence of malignancy. A result letter of this
screening mammogram will be mailed directly to the patient.

RECOMMENDATION:
Screening mammogram in one year. (Code:[BY])

BI-RADS CATEGORY  1: Negative.

## 2021-10-13 NOTE — Telephone Encounter (Signed)
Submitted a Prior Authorization request to [facility] for RINVOQ via CoverMyMeds. Will update once we receive a response. ? ?Key: [id number] ?

## 2021-10-14 ENCOUNTER — Other Ambulatory Visit (HOSPITAL_COMMUNITY): Payer: Self-pay

## 2021-10-14 NOTE — Telephone Encounter (Signed)
Received notification from [facility] regarding a prior authorization for [facility]. Authorization has been APPROVED from 10/13/21 to 10/13/22.  ? ?Patient must fill through [facility]: [phone] ([employer] eligible medication) ? ?Authorization # [id number] ? ?Entered patient into CompletePro portal. Awaiting copay card information to populate. ? ?Chesley Mires, PharmD, MPH, BCPS, CPP ?Clinical Pharmacist (Rheumatology and Pulmonology) ?

## 2021-10-15 LAB — CBC WITH DIFFERENTIAL/PLATELET
Absolute Monocytes: 567 cells/uL (ref 200–950)
Basophils Absolute: 41 cells/uL (ref 0–200)
Basophils Relative: 0.4 %
Eosinophils Absolute: 247 cells/uL (ref 15–500)
Eosinophils Relative: 2.4 %
HCT: 44.9 % (ref 35.0–45.0)
Hemoglobin: 15 g/dL (ref 11.7–15.5)
Lymphs Abs: 4192 cells/uL — ABNORMAL HIGH (ref 850–3900)
MCH: 29 pg (ref 27.0–33.0)
MCHC: 33.4 g/dL (ref 32.0–36.0)
MCV: 86.8 fL (ref 80.0–100.0)
MPV: 9.5 fL (ref 7.5–12.5)
Monocytes Relative: 5.5 %
Neutro Abs: 5253 cells/uL (ref 1500–7800)
Neutrophils Relative %: 51 %
Platelets: 352 10*3/uL (ref 140–400)
RBC: 5.17 10*6/uL — ABNORMAL HIGH (ref 3.80–5.10)
RDW: 13.5 % (ref 11.0–15.0)
Total Lymphocyte: 40.7 %
WBC: 10.3 10*3/uL (ref 3.8–10.8)

## 2021-10-15 LAB — COMPLETE METABOLIC PANEL WITH GFR
AG Ratio: 2.2 (calc) (ref 1.0–2.5)
ALT: 27 U/L (ref 6–29)
AST: 20 U/L (ref 10–35)
Albumin: 4.6 g/dL (ref 3.6–5.1)
Alkaline phosphatase (APISO): 75 U/L (ref 37–153)
BUN: 13 mg/dL (ref 7–25)
CO2: 27 mmol/L (ref 20–32)
Calcium: 9.7 mg/dL (ref 8.6–10.4)
Chloride: 104 mmol/L (ref 98–110)
Creat: 0.75 mg/dL (ref 0.50–1.03)
Globulin: 2.1 g/dL (calc) (ref 1.9–3.7)
Glucose, Bld: 86 mg/dL (ref 65–99)
Potassium: 4.2 mmol/L (ref 3.5–5.3)
Sodium: 141 mmol/L (ref 135–146)
Total Bilirubin: 0.8 mg/dL (ref 0.2–1.2)
Total Protein: 6.7 g/dL (ref 6.1–8.1)
eGFR: 97 mL/min/{1.73_m2} (ref >=60)

## 2021-10-15 LAB — QUANTIFERON-TB GOLD PLUS
Mitogen-NIL: >10 IU/mL
NIL: 0.03 IU/mL
QuantiFERON-TB Gold Plus: NEGATIVE
TB1-NIL: 0.01 IU/mL
TB2-NIL: 0 IU/mL

## 2021-10-17 MED ORDER — RINVOQ 15 MG PO TB24: 15.0000 mg | ORAL_TABLET | Freq: Every day | ORAL | 0 refills | Status: DC

## 2021-10-17 NOTE — Progress Notes (Signed)
Lab results lok fine for starting the rinvoq as planned. We can recheck her labs as when we follow up next month assuming she can start the medication soon.

## 2021-10-17 NOTE — Telephone Encounter (Signed)
Rinvoq copay card information populated in CompletePro portal: ?ID: [id number] ?Rx GROUP: [id number] ?Rx BIN: [id number] ?Rx PCN: [id number] ? ?Spoke with patient. Her last Enbrel dose was on 10/10/21 and she has not scheduled refill of medication. She will call [facility] to schedule shipment. ? ?Chesley Mires, PharmD, MPH, BCPS, CPP ?Clinical Pharmacist (Rheumatology and Pulmonology) ?

## 2021-10-19 ENCOUNTER — Other Ambulatory Visit: Payer: Self-pay

## 2021-10-19 MED ORDER — SEMAGLUTIDE(0.25 OR 0.5MG/DOS) 2 MG/3ML ~~LOC~~ SOPN: 0.5000 mg | PEN_INJECTOR | SUBCUTANEOUS | 0 refills | Status: DC

## 2021-10-20 ENCOUNTER — Other Ambulatory Visit: Payer: Self-pay | Admitting: Medical-Surgical

## 2021-10-20 MED ORDER — SEMAGLUTIDE(0.25 OR 0.5MG/DOS) 2 MG/3ML ~~LOC~~ SOPN: 0.5000 mg | PEN_INJECTOR | SUBCUTANEOUS | 0 refills | Status: DC

## 2021-10-20 NOTE — Telephone Encounter (Signed)
Patient is requesting for rx to be sent to a local pharmacy. Previously sent to a mail order pharmacy.

## 2021-10-24 NOTE — Progress Notes (Unsigned)
   Established Patient Office Visit  Subjective   Patient ID: Breanna [name], female    DOB: 06-10-1970  Age: 51 y.o. MRN: [id number]  No chief complaint on file.   HPI Pleasant 51 year old female presenting today for mood follow-up.  Approximately 6 weeks ago, we increased her Cymbalta from 30 mg daily to 60 mg daily.  ROS    Objective:     There were no vitals taken for this visit. {Vitals History (Optional):23777}  Physical Exam   No results found for any visits on 10/25/21.  {Labs (Optional):23779}  The 10-year ASCVD risk score (Arnett DK, et al., 2019) is: 2%    Assessment & Plan:   Problem List Items Addressed This Visit       Other   Anxiety - Primary    No follow-ups on file.    Samuel Bouche, NP

## 2021-10-25 ENCOUNTER — Ambulatory Visit: Payer: BC Managed Care – PPO | Admitting: Medical-Surgical

## 2021-10-25 ENCOUNTER — Encounter: Payer: Self-pay | Admitting: Medical-Surgical

## 2021-10-25 VITALS — BP 122/81 | HR 77 | Resp 20 | Ht 63.0 in | Wt 198.6 lb

## 2021-10-25 DIAGNOSIS — F419 Anxiety disorder, unspecified: Secondary | ICD-10-CM

## 2021-10-25 DIAGNOSIS — Z1211 Encounter for screening for malignant neoplasm of colon: Secondary | ICD-10-CM | POA: Diagnosis not present

## 2021-10-25 NOTE — Assessment & Plan Note (Signed)
Discussed various options for colon cancer screening.  She would like to proceed with Cologuard.  Ordered today.  Advised her that should the Cologuard come back positive, the next logical step would be an evaluation with colonoscopy.  Patient verbalized understanding is agreeable to the plan.

## 2021-10-25 NOTE — Assessment & Plan Note (Signed)
Continue semaglutide 0.5 mg weekly for the next 3 weeks.  If tolerating well, she will reach out to me and let me know if she is ready to increase her dose to 1 mg weekly.

## 2021-10-25 NOTE — Assessment & Plan Note (Signed)
Great improvement in PHQ-9 and GAD-7 scores.  She feels like the medication is working very well for her.  Discussed possible dosage adjustment however we want to use the lowest effective dose and she would like to give this more time and evaluate if she feels that a higher dose would work better.  For now continue Cymbalta 60 mg daily.

## 2021-10-27 ENCOUNTER — Encounter: Payer: Self-pay | Admitting: Medical-Surgical

## 2021-10-28 ENCOUNTER — Other Ambulatory Visit: Payer: Self-pay | Admitting: Neurology

## 2021-10-28 MED ORDER — CELECOXIB 200 MG PO CAPS: ORAL_CAPSULE | ORAL | 2 refills | Status: DC

## 2021-11-10 DIAGNOSIS — Z1211 Encounter for screening for malignant neoplasm of colon: Secondary | ICD-10-CM | POA: Diagnosis not present

## 2021-11-16 ENCOUNTER — Other Ambulatory Visit: Payer: Self-pay | Admitting: Medical-Surgical

## 2021-11-18 ENCOUNTER — Other Ambulatory Visit: Payer: Self-pay

## 2021-11-18 LAB — COLOGUARD: COLOGUARD: NEGATIVE

## 2021-11-18 MED ORDER — SEMAGLUTIDE (1 MG/DOSE) 4 MG/3ML ~~LOC~~ SOPN: 1.0000 mg | PEN_INJECTOR | SUBCUTANEOUS | 0 refills | Status: DC

## 2021-11-18 NOTE — Progress Notes (Signed)
Patient would like to increase to 1 MG.

## 2021-11-18 NOTE — Progress Notes (Signed)
Semaglutide 1 mg weekly sent to the mail order pharmacy on file. ___________________________________________ Thayer Ohm, DNP, APRN, FNP-BC Primary Care and Sports Medicine [facility] [place]

## 2021-11-25 ENCOUNTER — Ambulatory Visit: Payer: BC Managed Care – PPO | Admitting: Internal Medicine

## 2021-11-25 ENCOUNTER — Encounter: Payer: Self-pay | Admitting: Internal Medicine

## 2021-11-25 VITALS — BP 128/81 | HR 86 | Resp 15 | Ht 63.0 in | Wt 192.0 lb

## 2021-11-25 DIAGNOSIS — E782 Mixed hyperlipidemia: Secondary | ICD-10-CM

## 2021-11-25 DIAGNOSIS — Z79899 Other long term (current) drug therapy: Secondary | ICD-10-CM

## 2021-11-25 DIAGNOSIS — M5030 Other cervical disc degeneration, unspecified cervical region: Secondary | ICD-10-CM | POA: Diagnosis not present

## 2021-11-25 DIAGNOSIS — M4690 Unspecified inflammatory spondylopathy, site unspecified: Secondary | ICD-10-CM | POA: Diagnosis not present

## 2021-11-26 LAB — COMPLETE METABOLIC PANEL WITH GFR
AG Ratio: 1.9 (calc) (ref 1.0–2.5)
ALT: 67 U/L — ABNORMAL HIGH (ref 6–29)
AST: 38 U/L — ABNORMAL HIGH (ref 10–35)
Albumin: 5 g/dL (ref 3.6–5.1)
Alkaline phosphatase (APISO): 70 U/L (ref 37–153)
BUN: 18 mg/dL (ref 7–25)
CO2: 28 mmol/L (ref 20–32)
Calcium: 10.3 mg/dL (ref 8.6–10.4)
Chloride: 103 mmol/L (ref 98–110)
Creat: 0.79 mg/dL (ref 0.50–1.03)
Globulin: 2.6 g/dL (calc) (ref 1.9–3.7)
Glucose, Bld: 96 mg/dL (ref 65–99)
Potassium: 4.8 mmol/L (ref 3.5–5.3)
Sodium: 141 mmol/L (ref 135–146)
Total Bilirubin: 0.9 mg/dL (ref 0.2–1.2)
Total Protein: 7.6 g/dL (ref 6.1–8.1)
eGFR: 91 mL/min/{1.73_m2} (ref >=60)

## 2021-11-26 LAB — CBC WITH DIFFERENTIAL/PLATELET
Absolute Monocytes: 525 cells/uL (ref 200–950)
Basophils Absolute: 21 cells/uL (ref 0–200)
Basophils Relative: 0.2 %
Eosinophils Absolute: 144 cells/uL (ref 15–500)
Eosinophils Relative: 1.4 %
HCT: 46.7 % — ABNORMAL HIGH (ref 35.0–45.0)
Hemoglobin: 15.5 g/dL (ref 11.7–15.5)
Lymphs Abs: 3162 cells/uL (ref 850–3900)
MCH: 28.9 pg (ref 27.0–33.0)
MCHC: 33.2 g/dL (ref 32.0–36.0)
MCV: 87.1 fL (ref 80.0–100.0)
MPV: 9.5 fL (ref 7.5–12.5)
Monocytes Relative: 5.1 %
Neutro Abs: 6448 cells/uL (ref 1500–7800)
Neutrophils Relative %: 62.6 %
Platelets: 370 10*3/uL (ref 140–400)
RBC: 5.36 10*6/uL — ABNORMAL HIGH (ref 3.80–5.10)
RDW: 13.4 % (ref 11.0–15.0)
Total Lymphocyte: 30.7 %
WBC: 10.3 10*3/uL (ref 3.8–10.8)

## 2021-11-26 LAB — LIPID PANEL
Cholesterol: 149 mg/dL (ref <200)
HDL: 62 mg/dL (ref >=50)
LDL Cholesterol (Calc): 60 mg/dL (calc)
Non-HDL Cholesterol (Calc): 87 mg/dL (calc) (ref <130)
Total CHOL/HDL Ratio: 2.4 (calc) (ref <5.0)
Triglycerides: 196 mg/dL — ABNORMAL HIGH (ref <150)

## 2021-11-28 ENCOUNTER — Other Ambulatory Visit: Payer: Self-pay | Admitting: *Deleted

## 2021-11-28 DIAGNOSIS — R7989 Other specified abnormal findings of blood chemistry: Secondary | ICD-10-CM

## 2021-11-28 DIAGNOSIS — Z79899 Other long term (current) drug therapy: Secondary | ICD-10-CM

## 2021-12-05 ENCOUNTER — Encounter: Payer: Self-pay | Admitting: Internal Medicine

## 2021-12-13 ENCOUNTER — Ambulatory Visit (INDEPENDENT_AMBULATORY_CARE_PROVIDER_SITE_OTHER): Payer: BC Managed Care – PPO

## 2021-12-13 ENCOUNTER — Ambulatory Visit: Payer: BC Managed Care – PPO | Admitting: Sports Medicine

## 2021-12-13 DIAGNOSIS — G8929 Other chronic pain: Secondary | ICD-10-CM

## 2021-12-13 DIAGNOSIS — M7711 Lateral epicondylitis, right elbow: Secondary | ICD-10-CM | POA: Diagnosis not present

## 2021-12-13 DIAGNOSIS — M25512 Pain in left shoulder: Secondary | ICD-10-CM | POA: Diagnosis not present

## 2021-12-13 NOTE — Progress Notes (Signed)
    Procedures performed today:    Procedure: Real-time Ultrasound Guided injection of the left subacromial bursa Device: Samsung [id number]  Verbal informed consent obtained.  Time-out conducted.  Noted no overlying erythema, induration, or other signs of local infection.  Skin prepped in a sterile fashion.  Local anesthesia: Topical Ethyl chloride.  With sterile technique and under real time ultrasound guidance: Noted intact cuff, 1 cc Kenalog 40, 1 cc lidocaine, 1 cc bupivacaine injected easily Completed without difficulty  Advised to call if fevers/chills, erythema, induration, drainage, or persistent bleeding.  Images permanently stored and available for review in PACS.  Impression: Technically successful ultrasound guided injection.  Independent interpretation of notes and tests performed by another provider:   None.  Brief History, Exam, Impression, and Recommendations:    Chronic left shoulder pain Kynisha returns, she is a very pleasant 51 year old female, she has a history of chronic shoulder pain, impingement on exam, rotator cuff tendinitis and subacromial bursitis on MRI. She had an injection in the summertime 2022 and did really well, she also improved after cervical epidural. Unfortunately having recurrence of pain, localized over the deltoid. Pat impingement signs on exam. Repeat subacromial injection today, return to see me as needed.  Lateral epicondylitis, right elbow Done also has had about a month pain right lateral elbow after picking up an object, she tried a tennis elbow brace without much improvement. She has tenderness at the common extensor tendon origin, continue brace, adding home conditioning, return in 6 weeks, injection if not better.    ____________________________________________ Ihor Austin. Benjamin Stain, M.D., ABFM., CAQSM., AME. Primary Care and Sports Medicine [facility]  Adjunct Professor of Family Medicine  [place]  [employer]  Restaurant manager, fast food

## 2021-12-13 NOTE — Assessment & Plan Note (Signed)
Breanna [name] returns, she is a very pleasant 51 year old female, she has a history of chronic shoulder pain, impingement on exam, rotator cuff tendinitis and subacromial bursitis on MRI. She had an injection in the summertime 2022 and did really well, she also improved after cervical epidural. Unfortunately having recurrence of pain, localized over the deltoid. Pat impingement signs on exam. Repeat subacromial injection today, return to see me as needed.

## 2021-12-13 NOTE — Assessment & Plan Note (Signed)
Done also has had about a month pain right lateral elbow after picking up an object, she tried a tennis elbow brace without much improvement. She has tenderness at the common extensor tendon origin, continue brace, adding home conditioning, return in 6 weeks, injection if not better.

## 2021-12-20 ENCOUNTER — Other Ambulatory Visit: Payer: Self-pay | Admitting: *Deleted

## 2021-12-20 DIAGNOSIS — Z79899 Other long term (current) drug therapy: Secondary | ICD-10-CM

## 2021-12-20 DIAGNOSIS — R7989 Other specified abnormal findings of blood chemistry: Secondary | ICD-10-CM | POA: Diagnosis not present

## 2021-12-21 LAB — AST: AST: 42 U/L — ABNORMAL HIGH (ref 10–35)

## 2021-12-21 LAB — ALT: ALT: 91 U/L — ABNORMAL HIGH (ref 6–29)

## 2021-12-22 ENCOUNTER — Encounter: Payer: Self-pay | Admitting: Internal Medicine

## 2022-01-23 NOTE — Progress Notes (Unsigned)
   Established Patient Office Visit  Subjective   Patient ID: Breanna [name], female   DOB: 1970/07/18 Age: 51 y.o. MRN: [id number]   No chief complaint on file.   HPI Pleasant 51 year old female presenting today for follow-up on mood. Mood concerns Anxiety: *** Depression: *** Contributing factors: *** Medications: *** Compliant: *** Meds helpful: *** Side effects: *** Past medications: *** Counseling: *** SI/HI: ***    Objective:    There were no vitals filed for this visit.  Physical Exam   No results found for this or any previous visit (from the past 24 hour(s)).   {Labs (Optional):23779}  The 10-year ASCVD risk score (Arnett DK, et al., 2019) is: 0.7%   Values used to calculate the score:     Age: 62 years     Sex: Female     Is Non-Hispanic African American: No     Diabetic: No     Tobacco smoker: No     Systolic Blood Pressure: 128 mmHg     Is BP treated: No     HDL Cholesterol: 62 mg/dL     Total Cholesterol: 149 mg/dL   Assessment & Plan:   No problem-specific Assessment & Plan notes found for this encounter.   No follow-ups on file.  ___________________________________________ Thayer Ohm, DNP, APRN, FNP-BC Primary Care and Sports Medicine [facility] [place]

## 2022-01-24 ENCOUNTER — Encounter: Payer: Self-pay | Admitting: Medical-Surgical

## 2022-01-24 ENCOUNTER — Ambulatory Visit: Payer: BC Managed Care – PPO | Admitting: Medical-Surgical

## 2022-01-24 VITALS — BP 109/71 | HR 88 | Ht 63.0 in | Wt 184.5 lb

## 2022-01-24 DIAGNOSIS — F419 Anxiety disorder, unspecified: Secondary | ICD-10-CM | POA: Diagnosis not present

## 2022-01-24 DIAGNOSIS — Z23 Encounter for immunization: Secondary | ICD-10-CM | POA: Diagnosis not present

## 2022-01-24 DIAGNOSIS — Z7689 Persons encountering health services in other specified circumstances: Secondary | ICD-10-CM

## 2022-01-24 MED ORDER — SEMAGLUTIDE (1 MG/DOSE) 4 MG/3ML ~~LOC~~ SOPN: 1.0000 mg | PEN_INJECTOR | SUBCUTANEOUS | 1 refills | Status: DC

## 2022-01-24 MED ORDER — DULOXETINE HCL 60 MG PO CPEP: 60.0000 mg | ORAL_CAPSULE | Freq: Every day | ORAL | 1 refills | Status: DC

## 2022-01-25 NOTE — Progress Notes (Deleted)
Office Visit Note  Patient: Breanna [name]             Date of Birth: 1970/10/20           MRN: [id number]             PCP: Samuel Bouche, NP Referring: Samuel Bouche, NP Visit Date: 01/30/2022   Subjective:  No chief complaint on file.   History of Present Illness: Breanna [name] is a 51 y.o. female here for follow up ***   Previous HPI 11/25/2021 Breanna [name] is a 51 y.o. 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 She feels symptoms are partially improving since starting the medication. Less stiffness and pain in the low back especially during the night and after stationary positions. She still sees some swelling in her fingers on both hands. Shoulder and knee pains are about the same. She noticed some increase in facial acne otherwise no noticeable side effects.   Previous HPI 10/12/21 Breanna [name] is a 51 y.o. 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 She has not seen much additional improvement staying on the Enbrel. Back stiffness is worst every morning. More recently having some left shoulder and right elbow pain also but these are only stiff for less than 30 minutes in mornings. She has increased in skin peeling on fingertips of both hands she thinks this is related to washing and soap and has had similar problems before.   Previous HPI 07/14/2021 Breanna [name] is a 51 y.o. female here for follow up axial spondyloarthritis on Enbrel 50 mg [place] weekly. At last visit she had not yet seen a major change in symptoms with this treatment.  She feels symptoms are partially but noticeably improved since starting the Enbrel.  She describes nighttime awakenings from about 4 times to 3 times per night.  Her morning stiffness is decreased to about 10 minutes duration.  Low back pain is overall decreased in severity but especially the pain after waking and stationary  positions.  She had a recent neck epidural treatment that helped a lot with neck pain though she still notices some stiffness to range of motion and crepitus.   Previous HPI 04/14/21 Breanna [name] is a 51 y.o. 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 She has not noticed any large change in symptoms after starting this medication. Still has morning stiffness and low back pain, some hand pain, and some night time awakenings. She reports increased left shoulder pain especially with raising her arm overhead more since the past 2-3 weeks. She had joint injection for this in July with a good improvement until now. She also tried increasing the lyrica medication for joint pain and some cervical spine degenerative changes with possible compression, but stopped this due to feeling poorly on this and worse at higher dose.   Previous HPI 12/28/20 Breanna [name] is a 51 y.o. female here for axial and peripheral spondyloarthritis. She previously saw a rheumatologist in [place] and took Humira for this but has been off treatment for at least a year. Review of previous rheumatology records indicates most recent treatment was on methotrexate and humira with reasonably good disease control. Apparently GI intolerance of methotrexate with titration to 25 mg PO weekly but tolerated at lower doses and labs monitoring were okay. She previously had joint involvement with pain in the  lower thoracic spine, lumbar spine, right SI joint, and bilateral heels. She has previously reported joint pain dating back at least 20 years in duration. She moved from [place] due to work so has been off treatment since that time. She has noticed some increase in joint pain and stiffness but worst symptom increase is fatigue. She felt Humira was controlled symptoms well initially with loss of improvement, and addition of methotrexate did not improve much. She currently has neck and back pain mostly,  at night and in the morning but also increase in neck pain during the day working. She recently underwent left shoulder injection  a month ago with improvement in pain there. Otherwise she has not taken any steroid medication recently.   Outside labs reviewed HLA-B27 pos   DMARD Hx Humira 2020-? MTX 2020-? NSAIDs - inadequate response   No Rheumatology ROS completed.   PMFS History:  Patient Active Problem List   Diagnosis Date Noted   Lateral epicondylitis, right elbow 12/13/2021   Colon cancer screening 10/25/2021   Gastroesophageal reflux disease without esophagitis 04/14/2021   Menopause 04/14/2021   Morbid obesity ([place]) 04/14/2021   Pure hypercholesterolemia 04/14/2021   Stress incontinence (female)  04/14/2021   High risk medication use 12/28/2020   Primary osteoarthritis of right knee 12/21/2020   Morton's neuroma, right 12/21/2020   Chronic left shoulder pain 11/23/2020   DDD (degenerative disc disease), cervical 11/23/2020   Obesity, Class II, BMI 35-39.9 07/28/2020   Hyperlipidemia    Asthma    Axial spondyloarthritis ([place])    Anxiety     Past Medical History:  Diagnosis Date   Ankylosing spondylitis ([facility])    Anxiety    Arthritis    Asthma    GERD (gastroesophageal reflux disease)    Hiatal hernia    Hyperlipidemia    Menopause    Shingles     Family History  Problem Relation Age of Onset   Hypertension Mother    Heart attack Father    Healthy Brother    HIV Son    Lupus Cousin    Rheum arthritis Cousin    Past Surgical History:  Procedure Laterality Date   BREAST SURGERY     CARPAL TUNNEL RELEASE     epidural steroid injection  07/05/2021   in neck   [place]     Social History   Social History Narrative   Not on file   Immunization History  Administered Date(s) Administered   Influenza,inj,Quad PF,6+ Mos 08/01/2021, 01/24/2022   Influenza-Unspecified 03/05/2020   Moderna  Sars-Covid-2 Vaccination 06/12/2019, 08/22/2019, 03/30/2020   Tdap 05/05/2013     Objective: Vital Signs: There were no vitals taken for this visit.   Physical Exam   Musculoskeletal Exam: ***  CDAI Exam: CDAI Score: -- Patient Global: --; Provider Global: -- Swollen: --; Tender: -- Joint Exam 01/30/2022   No joint exam has been documented for this visit   There is currently no information documented on the homunculus. Go to the Rheumatology activity and complete the homunculus joint exam.  Investigation: No additional findings.  Imaging: No results found.  Recent Labs: Lab Results  Component Value Date   WBC 10.3 11/25/2021   HGB 15.5 11/25/2021   PLT 370 11/25/2021   NA 141 11/25/2021   K 4.8 11/25/2021   CL 103 11/25/2021   CO2 28 11/25/2021   GLUCOSE 96 11/25/2021   BUN 18  11/25/2021   CREATININE 0.79 11/25/2021   BILITOT 0.9 11/25/2021   ALKPHOS 91 07/01/2020   AST 42 (H) 12/20/2021   ALT 91 (H) 12/20/2021   PROT 7.6 11/25/2021   ALBUMIN 4.6 07/01/2020   CALCIUM 10.3 11/25/2021   GFRAA 87 09/16/2020   QFTBGOLDPLUS NEGATIVE 10/12/2021    Speciality Comments: No specialty comments available.  Procedures:  No procedures performed Allergies: Lexapro [escitalopram], Lipitor [atorvastatin], Meloxicam, Phendimetrazine tartrate, and Aspirin   Assessment / Plan:     Visit Diagnoses: No diagnosis found.  ***  Orders: No orders of the defined types were placed in this encounter.  No orders of the defined types were placed in this encounter.    Follow-Up Instructions: No follow-ups on file.   Bertram Savin, RT  Note - This record has been created using Editor, [employer].  Chart creation errors have been sought, but may not always  have been located. Such creation errors do not reflect on  the standard of medical care.

## 2022-01-26 ENCOUNTER — Ambulatory Visit: Payer: BC Managed Care – PPO | Admitting: Sports Medicine

## 2022-01-26 ENCOUNTER — Ambulatory Visit (INDEPENDENT_AMBULATORY_CARE_PROVIDER_SITE_OTHER): Payer: BC Managed Care – PPO

## 2022-01-26 DIAGNOSIS — M25512 Pain in left shoulder: Secondary | ICD-10-CM

## 2022-01-26 DIAGNOSIS — M7711 Lateral epicondylitis, right elbow: Secondary | ICD-10-CM

## 2022-01-26 DIAGNOSIS — G8929 Other chronic pain: Secondary | ICD-10-CM

## 2022-01-26 DIAGNOSIS — M5030 Other cervical disc degeneration, unspecified cervical region: Secondary | ICD-10-CM | POA: Diagnosis not present

## 2022-01-26 NOTE — Assessment & Plan Note (Signed)
Breanna [name] returns, she is a pleasant 51 year old female, tennis elbow, failed conservative treatment, injected today. Return to see me in 6 weeks as needed.

## 2022-01-26 NOTE — Assessment & Plan Note (Signed)
Chronic neck pain, cervical DDD C5-C7 with moderate central canal stenosis and left C7 distribution radiculitis, left-sided cervical epidural in January seems to have resolved all of her symptoms, now having recurrence of discomfort so proceeding with repeat left C6-C7 interlaminar epidural.

## 2022-01-26 NOTE — Progress Notes (Signed)
    Procedures performed today:    Procedure: Real-time Ultrasound Guided injection of the right common extensor tendon origin Device: Samsung HS60  Verbal informed consent obtained.  Time-out conducted.  Noted no overlying erythema, induration, or other signs of local infection.  Skin prepped in a sterile fashion.  Local anesthesia: Topical Ethyl chloride.  With sterile technique and under real time ultrasound guidance: Noted a bit of hypoechoic change at the common extensor tendon origin at the lateral condyle, 1 cc Kenalog 40, 1 cc lidocaine, 1 cc bupivacaine injected easily Completed without difficulty  Advised to call if fevers/chills, erythema, induration, drainage, or persistent bleeding.  Images permanently stored and available for review in PACS.  Impression: Technically successful ultrasound guided injection.  Independent interpretation of notes and tests performed by another provider:   None.  Brief History, Exam, Impression, and Recommendations:    Lateral epicondylitis, right elbow Breanna [name] returns, she is a pleasant 51 year old female, tennis elbow, failed conservative treatment, injected today. Return to see me in 6 weeks as needed.  Chronic left shoulder pain Chronic shoulder pain, cuff tendinitis and subacromial bursitis noted on MRI. Subacromial injection at the last visit has resolved her symptoms.   DDD (degenerative disc disease), cervical Chronic neck pain, cervical DDD C5-C7 with moderate central canal stenosis and left C7 distribution radiculitis, left-sided cervical epidural in January seems to have resolved all of her symptoms, now having recurrence of discomfort so proceeding with repeat left C6-C7 interlaminar epidural.    ____________________________________________ Ihor Austin. Benjamin Stain, M.D., ABFM., CAQSM., AME. 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

## 2022-01-26 NOTE — Assessment & Plan Note (Signed)
Chronic shoulder pain, cuff tendinitis and subacromial bursitis noted on MRI. Subacromial injection at the last visit has resolved her symptoms.

## 2022-01-30 ENCOUNTER — Ambulatory Visit: Payer: BC Managed Care – PPO | Admitting: Internal Medicine

## 2022-01-30 DIAGNOSIS — M4690 Unspecified inflammatory spondylopathy, site unspecified: Secondary | ICD-10-CM

## 2022-01-30 DIAGNOSIS — M5030 Other cervical disc degeneration, unspecified cervical region: Secondary | ICD-10-CM

## 2022-01-30 DIAGNOSIS — Z79899 Other long term (current) drug therapy: Secondary | ICD-10-CM

## 2022-01-30 DIAGNOSIS — E782 Mixed hyperlipidemia: Secondary | ICD-10-CM

## 2022-01-31 ENCOUNTER — Other Ambulatory Visit: Payer: BC Managed Care – PPO

## 2022-02-09 NOTE — Progress Notes (Signed)
Office Visit Note  Patient: Breanna [name]             Date of Birth: [birth year]           MRN: [id number]             PCP: Christen Butter, NP Referring: Christen Butter, NP Visit Date: 02/20/2022   Subjective:  Follow-up (Not taking Rinvoq, here today to discuss other medications. Joint pain mainly in lower back. )   History of Present Illness: Breanna [name] is a 51 y.o. 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  Her symptoms are not terribly worse but definitely noticed a increase in the pain and stiffness again off rinvoq.  Previous HPI 11/25/2021 Breanna [name] is a 51 y.o. 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 She feels symptoms are partially improving since starting the medication. Less stiffness and pain in the low back especially during the night and after stationary positions. She still sees some swelling in her fingers on both hands. Shoulder and knee pains are about the same. She noticed some increase in facial acne otherwise no noticeable side effects.   Previous HPI 10/12/21 Breanna [name] is a 51 y.o. female here for follow up for axial spondyloarthritis on Enbrel 50 mg [place] weekly. She has not seen much additional improvement staying on the Enbrel. Back stiffness is worst every morning. More recently having some left shoulder and right elbow pain also but these are only stiff for less than 30 minutes in mornings. She has increased in skin peeling on fingertips of both hands she thinks this is related to washing and soap and has had similar problems before.   Previous HPI 07/14/2021 Breanna [name] is a 51 y.o. female here for follow up axial spondyloarthritis on Enbrel 50 mg [place] weekly. At last visit she had not yet seen a major change in symptoms with this treatment.  She feels symptoms are  partially but noticeably improved since starting the Enbrel.  She describes nighttime awakenings from about 4 times to 3 times per night.  Her morning stiffness is decreased to about 10 minutes duration.  Low back pain is overall decreased in severity but especially the pain after waking and stationary positions.  She had a recent neck epidural treatment that helped a lot with neck pain though she still notices some stiffness to range of motion and crepitus.   Previous HPI 04/14/21 Breanna [name] is a 51 y.o. 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 She has not noticed any large change in symptoms after starting this medication. Still has morning stiffness and low back pain, some hand pain, and some night time awakenings. She reports increased left shoulder pain especially with raising her arm overhead more since the past 2-3 weeks. She had joint injection for this in July with a good improvement until now. She also tried increasing the lyrica medication for joint pain and some cervical spine degenerative changes with possible compression, but stopped this due to feeling poorly on this and worse at higher dose.   Previous HPI 12/28/20 Breanna [name] is a 51 y.o. female here for axial and peripheral spondyloarthritis. She previously saw a rheumatologist in [place] and took Humira for this but has been off treatment for at least a year. Review of previous rheumatology records  indicates most recent treatment was on methotrexate and humira with reasonably good disease control. Apparently GI intolerance of methotrexate with titration to 25 mg PO weekly but tolerated at lower doses and labs monitoring were okay. She previously had joint involvement with pain in the lower thoracic spine, lumbar spine, right SI joint, and bilateral heels. She has previously reported joint pain dating back at least 20 years in duration. She moved from [place] due to work so has been  off treatment since that time. She has noticed some increase in joint pain and stiffness but worst symptom increase is fatigue. She felt Humira was controlled symptoms well initially with loss of improvement, and addition of methotrexate did not improve much. She currently has neck and back pain mostly, at night and in the morning but also increase in neck pain during the day working. She recently underwent left shoulder injection  a month ago with improvement in pain there. Otherwise she has not taken any steroid medication recently.   Outside labs reviewed HLA-B27 pos   DMARD Hx Enbrel 2022-2023 Humira 2020-? MTX 2020-? NSAIDs - inadequate response     Review of Systems  Constitutional:  Positive for fatigue.  HENT:  Negative for mouth sores and mouth dryness.   Eyes:  Positive for dryness.  Respiratory:  Negative for shortness of breath.   Cardiovascular:  Negative for chest pain and palpitations.  Gastrointestinal:  Negative for blood in stool, constipation and diarrhea.  Endocrine: Negative for increased urination.  Genitourinary:  Negative for involuntary urination.  Musculoskeletal:  Positive for joint pain, joint pain and morning stiffness. Negative for gait problem, joint swelling, myalgias, muscle weakness, muscle tenderness and myalgias.  Skin:  Negative for color change, rash, hair loss and sensitivity to sunlight.  Allergic/Immunologic: Negative for susceptible to infections.  Neurological:  Positive for headaches. Negative for dizziness.  Hematological:  Negative for swollen glands.  Psychiatric/Behavioral:  Positive for sleep disturbance. Negative for depressed mood. The patient is nervous/anxious.     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 02/20/2022   Lateral epicondylitis, right elbow 12/13/2021   Colon cancer screening 10/25/2021   Gastroesophageal reflux disease without esophagitis 04/14/2021    Menopause 04/14/2021   Morbid obesity ([place]) 04/14/2021   Pure hypercholesterolemia 04/14/2021   Stress incontinence (female)  04/14/2021   High risk medication use 12/28/2020   Primary osteoarthritis of right knee 12/21/2020   Morton's neuroma, right 12/21/2020   Chronic left shoulder pain 11/23/2020   DDD (degenerative disc disease), cervical 11/23/2020   Obesity, Class II, BMI 35-39.9 07/28/2020   Hyperlipidemia    Asthma    Axial spondyloarthritis ([place])    Anxiety     Past Medical History:  Diagnosis Date   Ankylosing spondylitis ([place])    Anxiety    Arthritis    Asthma    GERD (gastroesophageal reflux disease)    Hiatal hernia    Hyperlipidemia    Menopause    Shingles     Family History  Problem Relation Age of Onset   Hypertension Mother    Heart attack Father    Healthy Brother    HIV Son    Lupus Cousin    Rheum arthritis Cousin    Past Surgical History:  Procedure Laterality Date   BREAST SURGERY     CARPAL TUNNEL RELEASE     epidural steroid injection  07/05/2021   in neck   GALLBLADDER SURGERY  KNEE SURGERY     TOTAL ABDOMINAL HYSTERECTOMY     Social History   Social History Narrative   Not on file   Immunization History  Administered Date(s) Administered   Influenza,inj,Quad PF,6+ Mos 08/01/2021, 01/24/2022   Influenza-Unspecified 03/05/2020   Moderna Sars-Covid-2 Vaccination 06/12/2019, 08/22/2019, 03/30/2020   Tdap 05/05/2013     Objective: Vital Signs: BP 119/82 (BP Location: Left Arm, Patient Position: Sitting, Cuff Size: Large)   Pulse 89   Resp 15   Ht $R'5\' 3"'QP$  (1.6 m)   Wt 179 lb 6.4 oz (81.4 kg)   BMI 31.78 kg/m    Physical Exam Constitutional:      Appearance: She is obese.  Cardiovascular:     Rate and Rhythm: Normal rate and regular rhythm.  Pulmonary:     Effort: Pulmonary effort is normal.     Breath sounds: Normal breath sounds.  Musculoskeletal:     Right lower leg: No edema.     Left lower leg: No edema.   Skin:    General: Skin is warm and dry.     Findings: No rash.  Neurological:     Mental Status: She is alert.  Psychiatric:        Mood and Affect: Mood normal.      Musculoskeletal Exam:  Shoulders full ROM thumb soreness with overhead abduction and pressure laterally Elbows full ROM no tenderness or swelling Wrists full ROM no tenderness or swelling Fingers full ROM no tenderness or swelling Low back tenderness to pressure midline and bilaterally along top of iliac crest, mild lateral hip no pain with internal/external rotation Knees full ROM no tenderness or swelling   Investigation: No additional findings.  Imaging: DG INJECT DIAG/THERA/INC NEEDLE/CATH/PLC EPI/CERV/THOR W/IMG  Result Date: 02/13/2022 CLINICAL DATA:  Spondylosis without myelopathy. Good response to the previous injection but with recurrence of symptoms. Pain worse on the left than the right. FLUOROSCOPY: Radiation Exposure Index (as provided by the fluoroscopic device): 1 minute 25 seconds. 24.24 micro gray meter squared PROCEDURE: CERVICAL EPIDURAL INJECTION An interlaminar approach was performed on the left at C6-7. A 20 gauge epidural needle was advanced using loss-of-resistance technique. DIAGNOSTIC EPIDURAL INJECTION Injection of Isovue-M 300 shows a good epidural pattern with spread above and below the level of needle placement, primarily on the right initially. The needle was repositioned such that preferential left-sided flow was achieved. No vascular opacification is seen. THERAPEUTIC EPIDURAL INJECTION 1.5 ml of Kenalog 40 mixed with 1 ml of 1% Lidocaine and 2 ml of normal saline were then instilled. The procedure was well-tolerated, and the patient was discharged thirty minutes following the injection in good condition. IMPRESSION: Technically successful repeat epidural injection on the left at C6-7. Electronically Signed   By: Nelson Chimes M.D.   On: 02/13/2022 13:13    Recent Labs: Lab Results   Component Value Date   WBC 12.8 (H) 02/20/2022   HGB 15.6 (H) 02/20/2022   PLT 436 (H) 02/20/2022   NA 141 02/20/2022   K 4.4 02/20/2022   CL 101 02/20/2022   CO2 28 02/20/2022   GLUCOSE 101 (H) 02/20/2022   BUN 14 02/20/2022   CREATININE 0.87 02/20/2022   BILITOT 0.8 02/20/2022   ALKPHOS 91 07/01/2020   AST 37 (H) 02/20/2022   ALT 78 (H) 02/20/2022   PROT 7.2 02/20/2022   ALBUMIN 4.6 07/01/2020   CALCIUM 9.8 02/20/2022   GFRAA 87 09/16/2020   QFTBGOLDPLUS NEGATIVE 10/12/2021    Speciality Comments: No specialty  comments available.  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  We will recheck CRP for inflammatory disease activity monitoring.  Discussed alternate treatments I think she would be candidate for Cosentyx which is not associated with effects on liver function like the Rinvoq.  High risk medication use - rinvoq 15 mg PO daily - Plan: CBC with Differential/Platelet, COMPLETE METABOLIC PANEL WITH GFR  Checking CBC and CMP to see if these are normalized now off of the Rinvoq.  If liver function tests remain persistently abnormal probably need some investigation such as right upper quadrant ultrasound to rule out underlying condition.  DDD (degenerative disc disease), cervical  Liver function test abnormality - Plan: COMPLETE METABOLIC PANEL WITH GFR, US Abdomen Limited RUQ (LIVER/GB)  Repeating testing with work-up plan as above.  Orders: Orders Placed This Encounter  Procedures   US Abdomen Limited RUQ (LIVER/GB)   C-reactive protein   CBC with Differential/Platelet   COMPLETE METABOLIC PANEL WITH GFR   No orders of the defined types were placed in this  encounter.    Follow-Up Instructions: Return in about 3 months (around 05/22/2022) for AS off med/COS start f/u 41mos.   Collier Salina, MD  Note - This record has been created using [employer].  Chart creation errors have been sought, but may not always  have been located. Such creation errors do not reflect on  the standard of medical care.

## 2022-02-13 ENCOUNTER — Ambulatory Visit
Admission: RE | Admit: 2022-02-13 | Discharge: 2022-02-13 | Disposition: A | Discharge status: Home / Self Care | Payer: BC Managed Care – PPO | Source: Ambulatory Visit | Attending: Sports Medicine | Admitting: Sports Medicine

## 2022-02-13 DIAGNOSIS — M47812 Spondylosis without myelopathy or radiculopathy, cervical region: Secondary | ICD-10-CM | POA: Diagnosis not present

## 2022-02-13 DIAGNOSIS — M5030 Other cervical disc degeneration, unspecified cervical region: Secondary | ICD-10-CM

## 2022-02-13 MED ORDER — TRIAMCINOLONE ACETONIDE 40 MG/ML IJ SUSP (RADIOLOGY)
60.0000 mg | Freq: Once | INTRAMUSCULAR | Status: AC
  Administered 2022-02-13: 60 mg via EPIDURAL

## 2022-02-13 MED ORDER — IOPAMIDOL (ISOVUE-M 300) INJECTION 61%
1.0000 mL | Freq: Once | INTRAMUSCULAR | Status: AC | PRN
  Administered 2022-02-13: 1 mL via EPIDURAL

## 2022-02-13 NOTE — Discharge Instructions (Signed)

## 2022-02-20 ENCOUNTER — Encounter: Payer: Self-pay | Admitting: Internal Medicine

## 2022-02-20 ENCOUNTER — Ambulatory Visit: Payer: BC Managed Care – PPO | Attending: Internal Medicine | Admitting: Internal Medicine

## 2022-02-20 VITALS — BP 119/82 | HR 89 | Resp 15 | Ht 63.0 in | Wt 179.4 lb

## 2022-02-20 DIAGNOSIS — R7989 Other specified abnormal findings of blood chemistry: Secondary | ICD-10-CM | POA: Diagnosis not present

## 2022-02-20 DIAGNOSIS — Z79899 Other long term (current) drug therapy: Secondary | ICD-10-CM | POA: Diagnosis not present

## 2022-02-20 DIAGNOSIS — M5030 Other cervical disc degeneration, unspecified cervical region: Secondary | ICD-10-CM | POA: Diagnosis not present

## 2022-02-20 DIAGNOSIS — E782 Mixed hyperlipidemia: Secondary | ICD-10-CM

## 2022-02-20 DIAGNOSIS — M4690 Unspecified inflammatory spondylopathy, site unspecified: Secondary | ICD-10-CM

## 2022-02-20 NOTE — Patient Instructions (Signed)
Secukinumab Injection What is this medication? SECUKINUMAB (sek ue KIN ue mab) treats autoimmune conditions, such as psoriasis and arthritis. It works by slowing down an overactive immune system. It is a monoclonal antibody. This medicine may be used for other purposes; ask your health care provider or pharmacist if you have questions. COMMON BRAND NAME(S): Cosentyx What should I tell my care team before I take this medication? They need to know if you have any of these conditions: Crohn's disease, ulcerative colitis, or other inflammatory bowel disease Immune system problems Infection or history of infection, such as a viral infection, chickenpox, cold sores, or herpes Recently received or are scheduled to receive a vaccine Tuberculosis, a positive skin test for tuberculosis, or recent close contact with someone who has tuberculosis An unusual or allergic reaction to secukinumab, latex, rubber, other medications, foods, dyes, or preservatives Pregnant or trying to get pregnant Breast-feeding How should I use this medication? This medication is injected under the skin. It can be given by your care team in a hospital or clinic setting. It may also be given at home. If you get this medication at home, you will be taught how to prepare and give it. Use it exactly as directed. Take it as directed on the prescription label. Keep taking it unless your care team tells you to stop. It is important that you put your used needles and syringes in a special sharps container. Do not put them in a trash can. If you do not have a sharps container, call your pharmacist or care team to get one. A special MedGuide will be given to you by the pharmacist with each prescription and refill. Be sure to read this information carefully each time. Talk to your care team about the use of this medication in children. While it may be prescribed for children as young as 2 years for selected conditions, precautions do  apply. Overdosage: If you think you have taken too much of this medicine contact a poison control center or emergency room at once. NOTE: This medicine is only for you. Do not share this medicine with others. What if I miss a dose? If you get this medication at the hospital or clinic: It is important not to miss your dose. Call your care team if you are unable to keep an appointment. If you give yourself this medication at home: If you miss a dose, take it as soon as you can. If it is almost time for your next dose, take only that dose. Do not take double or extra doses. Call your care team with questions. What may interact with this medication? Live virus vaccines This list may not describe all possible interactions. Give your health care provider a list of all the medicines, herbs, non-prescription drugs, or dietary supplements you use. Also tell them if you smoke, drink alcohol, or use illegal drugs. Some items may interact with your medicine. What should I watch for while using this medication? Visit your care team for regular checks on your progress. Tell your care team if your symptoms do not start to get better or if they get worse. You will be tested for tuberculosis (TB) before you start this medication. If your care team prescribes any medication for TB, you should start taking the TB medication before starting this medication. Make sure to finish the full course of TB medication. This medication may increase your risk of getting an infection. Call your care team for advice if you get a fever,   chills, sore throat, or other symptoms of a cold or flu. Do not treat yourself. Try to avoid being around people who are sick. This medication can decrease the response to a vaccine. If you need to get vaccinated, tell your care team if you have received this medication within the last 6 months. Extra booster doses may be needed. Talk to your care team to see if a different vaccination schedule is  needed. What side effects may I notice from receiving this medication? Side effects that you should report to your care team as soon as possible: Allergic reactions--skin rash, itching, hives, swelling of the face, lips, tongue, or throat Infection--fever, chills, cough, sore throat, wounds that don't heal, pain or trouble when passing urine, general feeling of discomfort or being unwell Sudden or severe stomach pain, bloody diarrhea, fever, nausea, vomiting Side effects that usually do not require medical attention (report to your care team if they continue or are bothersome): Diarrhea Runny or stuffy nose Sore throat This list may not describe all possible side effects. Call your doctor for medical advice about side effects. You may report side effects to FDA at 1-800-FDA-1088. Where should I keep my medication? Keep out of the reach of children and pets. Store in the refrigerator. Do not freeze. Keep it in the original carton until you are ready to use it. Protect from light. Do not shake. Remove the dose from the refrigerator about 30 minutes before it is time for you to use it. Use it within 4 days of removing it from the carton. Get rid of any unused medication after the expiration date. To get rid of medications that are no longer needed or have expired: Take the medication to a medication take-back program. Check with your pharmacy or law enforcement to find a location. If you cannot return the medication, ask your pharmacist or care team how to get rid of this medication safely. NOTE: This sheet is a summary. It may not cover all possible information. If you have questions about this medicine, talk to your doctor, pharmacist, or health care provider.  2023 Elsevier/Gold Standard (2021-06-08 00:00:00)  

## 2022-02-21 LAB — CBC WITH DIFFERENTIAL/PLATELET
Absolute Monocytes: 704 cells/uL (ref 200–950)
Basophils Absolute: 26 cells/uL (ref 0–200)
Basophils Relative: 0.2 %
Eosinophils Absolute: 90 cells/uL (ref 15–500)
Eosinophils Relative: 0.7 %
HCT: 46.5 % — ABNORMAL HIGH (ref 35.0–45.0)
Hemoglobin: 15.6 g/dL — ABNORMAL HIGH (ref 11.7–15.5)
Lymphs Abs: 4198 cells/uL — ABNORMAL HIGH (ref 850–3900)
MCH: 29.6 pg (ref 27.0–33.0)
MCHC: 33.5 g/dL (ref 32.0–36.0)
MCV: 88.2 fL (ref 80.0–100.0)
MPV: 9 fL (ref 7.5–12.5)
Monocytes Relative: 5.5 %
Neutro Abs: 7782 cells/uL (ref 1500–7800)
Neutrophils Relative %: 60.8 %
Platelets: 436 10*3/uL — ABNORMAL HIGH (ref 140–400)
RBC: 5.27 10*6/uL — ABNORMAL HIGH (ref 3.80–5.10)
RDW: 13.7 % (ref 11.0–15.0)
Total Lymphocyte: 32.8 %
WBC: 12.8 10*3/uL — ABNORMAL HIGH (ref 3.8–10.8)

## 2022-02-21 LAB — COMPLETE METABOLIC PANEL WITH GFR
AG Ratio: 2 (calc) (ref 1.0–2.5)
ALT: 78 U/L — ABNORMAL HIGH (ref 6–29)
AST: 37 U/L — ABNORMAL HIGH (ref 10–35)
Albumin: 4.8 g/dL (ref 3.6–5.1)
Alkaline phosphatase (APISO): 67 U/L (ref 37–153)
BUN: 14 mg/dL (ref 7–25)
CO2: 28 mmol/L (ref 20–32)
Calcium: 9.8 mg/dL (ref 8.6–10.4)
Chloride: 101 mmol/L (ref 98–110)
Creat: 0.87 mg/dL (ref 0.50–1.03)
Globulin: 2.4 g/dL (calc) (ref 1.9–3.7)
Glucose, Bld: 101 mg/dL — ABNORMAL HIGH (ref 65–99)
Potassium: 4.4 mmol/L (ref 3.5–5.3)
Sodium: 141 mmol/L (ref 135–146)
Total Bilirubin: 0.8 mg/dL (ref 0.2–1.2)
Total Protein: 7.2 g/dL (ref 6.1–8.1)
eGFR: 81 mL/min/{1.73_m2} (ref >=60)

## 2022-02-21 LAB — C-REACTIVE PROTEIN: CRP: 0.4 mg/L (ref <8.0)

## 2022-02-21 NOTE — Progress Notes (Signed)
Lab results still show some elevation in her liver enzymes. An effect from rinvoq would not be expected to stay around so long. Is she taking any OTC medications regularly? Because this has stayed mildly abnormal for at least 2 months, I recommend she go for a right upper quadrant ultrasound see if there is any evidence of fatty liver disease or other problem. We can order for this if she agrees, or else would recommend f/u with PCP about it.

## 2022-02-28 ENCOUNTER — Other Ambulatory Visit: Payer: BC Managed Care – PPO

## 2022-02-28 ENCOUNTER — Ambulatory Visit: Payer: BC Managed Care – PPO | Admitting: Internal Medicine

## 2022-03-02 ENCOUNTER — Ambulatory Visit
Admission: RE | Admit: 2022-03-02 | Discharge: 2022-03-02 | Disposition: A | Discharge status: Home / Self Care | Payer: BC Managed Care – PPO | Source: Ambulatory Visit | Attending: Internal Medicine | Admitting: Internal Medicine

## 2022-03-02 DIAGNOSIS — Z9049 Acquired absence of other specified parts of digestive tract: Secondary | ICD-10-CM | POA: Diagnosis not present

## 2022-03-02 DIAGNOSIS — R945 Abnormal results of liver function studies: Secondary | ICD-10-CM | POA: Diagnosis not present

## 2022-03-02 DIAGNOSIS — R7989 Other specified abnormal findings of blood chemistry: Secondary | ICD-10-CM

## 2022-03-14 ENCOUNTER — Ambulatory Visit: Payer: BC Managed Care – PPO | Admitting: Sports Medicine

## 2022-03-14 DIAGNOSIS — M25512 Pain in left shoulder: Secondary | ICD-10-CM | POA: Diagnosis not present

## 2022-03-14 DIAGNOSIS — G8929 Other chronic pain: Secondary | ICD-10-CM | POA: Diagnosis not present

## 2022-03-14 MED ORDER — TRAMADOL HCL 50 MG PO TABS: 50.0000 mg | ORAL_TABLET | Freq: Three times a day (TID) | ORAL | 0 refills | Status: AC | PRN

## 2022-03-14 NOTE — Assessment & Plan Note (Signed)
Breanna [name] returns, she is a very pleasant 51 year old female, she has chronic shoulder pain, we did an injection about 3 months ago and she did well for several weeks and then had a slight recurrence of pain. We obtained an MRI that showed rotator cuff tendinitis and subacromial bursitis. Unfortunately with the return of pain we need to treat this again, she will work aggressively on her home physical therapy. We will add tramadol to take on top of her Motrin. Return to see me in 6 weeks and if insufficient improvement we will do a repeat subacromial injection, followed by referral for arthroscopic debridement if insufficient improvement.

## 2022-03-14 NOTE — Progress Notes (Signed)
    Procedures performed today:    None.  Independent interpretation of notes and tests performed by another provider:   None.  Brief History, Exam, Impression, and Recommendations:    Chronic left shoulder pain Breanna [name] returns, she is a very pleasant 51 year old female, she has chronic shoulder pain, we did an injection about 3 months ago and she did well for several weeks and then had a slight recurrence of pain. We obtained an MRI that showed rotator cuff tendinitis and subacromial bursitis. Unfortunately with the return of pain we need to treat this again, she will work aggressively on her home physical therapy. We will add tramadol to take on top of her Motrin. Return to see me in 6 weeks and if insufficient improvement we will do a repeat subacromial injection, followed by referral for arthroscopic debridement if insufficient improvement.    ____________________________________________ Gwen Her. Dianah Field, M.D., ABFM., CAQSM., AME. Primary Care and Sports Medicine [facility]  Adjunct Professor of [place] [employer]  Risk manager

## 2022-04-25 ENCOUNTER — Ambulatory Visit: Payer: BC Managed Care – PPO | Admitting: Sports Medicine

## 2022-04-25 ENCOUNTER — Ambulatory Visit (INDEPENDENT_AMBULATORY_CARE_PROVIDER_SITE_OTHER): Payer: BC Managed Care – PPO

## 2022-04-25 DIAGNOSIS — G8929 Other chronic pain: Secondary | ICD-10-CM

## 2022-04-25 DIAGNOSIS — M25512 Pain in left shoulder: Secondary | ICD-10-CM

## 2022-04-25 DIAGNOSIS — S46012A Strain of muscle(s) and tendon(s) of the rotator cuff of left shoulder, initial encounter: Secondary | ICD-10-CM | POA: Diagnosis not present

## 2022-04-25 DIAGNOSIS — M19012 Primary osteoarthritis, left shoulder: Secondary | ICD-10-CM | POA: Diagnosis not present

## 2022-04-25 DIAGNOSIS — M7552 Bursitis of left shoulder: Secondary | ICD-10-CM | POA: Diagnosis not present

## 2022-04-25 NOTE — Progress Notes (Signed)
    Procedures performed today:    None.  Independent interpretation of notes and tests performed by another provider:   None.  Brief History, Exam, Impression, and Recommendations:    Chronic left shoulder pain Breanna [name] is a very pleasant 51 year old female, she has had over a year of pain left shoulder localized over the deltoid worse with overhead activities, she had a subacromial injection 3 months ago. She also had a repeat subacromial injection about 1 month ago. She has had greater than 6 weeks of aggressive physical therapy, oral NSAIDs and pain medications, but unfortunately continues to have persistent pain. For this reason we will proceed with an updated MRI for surgical planning.    ____________________________________________ Ihor Austin. Benjamin Stain, M.D., ABFM., CAQSM., AME. Primary Care and Sports Medicine [facility]  Adjunct Professor of Family Medicine  [place] [employer]  Restaurant manager, fast food

## 2022-04-25 NOTE — Assessment & Plan Note (Addendum)
Breanna [name] is a very pleasant 51 year old female, she has had over a year of pain left shoulder localized over the deltoid worse with overhead activities, she had a subacromial injection 3 months ago. She also had a repeat subacromial injection about 1 month ago. She has had greater than 6 weeks of aggressive physical therapy, oral NSAIDs and pain medications, but unfortunately continues to have persistent pain. For this reason we will proceed with an updated MRI for surgical planning.

## 2022-05-02 DIAGNOSIS — M25512 Pain in left shoulder: Secondary | ICD-10-CM | POA: Diagnosis not present

## 2022-05-08 DIAGNOSIS — M7522 Bicipital tendinitis, left shoulder: Secondary | ICD-10-CM | POA: Diagnosis not present

## 2022-05-08 DIAGNOSIS — M7552 Bursitis of left shoulder: Secondary | ICD-10-CM | POA: Diagnosis not present

## 2022-05-08 DIAGNOSIS — S46012A Strain of muscle(s) and tendon(s) of the rotator cuff of left shoulder, initial encounter: Secondary | ICD-10-CM | POA: Diagnosis not present

## 2022-05-08 DIAGNOSIS — G8918 Other acute postprocedural pain: Secondary | ICD-10-CM | POA: Diagnosis not present

## 2022-05-08 DIAGNOSIS — M7542 Impingement syndrome of left shoulder: Secondary | ICD-10-CM | POA: Diagnosis not present

## 2022-05-08 DIAGNOSIS — M75112 Incomplete rotator cuff tear or rupture of left shoulder, not specified as traumatic: Secondary | ICD-10-CM | POA: Diagnosis not present

## 2022-05-08 DIAGNOSIS — M19012 Primary osteoarthritis, left shoulder: Secondary | ICD-10-CM | POA: Diagnosis not present

## 2022-05-08 DIAGNOSIS — M24112 Other articular cartilage disorders, left shoulder: Secondary | ICD-10-CM | POA: Diagnosis not present

## 2022-05-08 HISTORY — PX: ROTATOR CUFF REPAIR: SHX139

## 2022-05-15 DIAGNOSIS — M25612 Stiffness of left shoulder, not elsewhere classified: Secondary | ICD-10-CM | POA: Diagnosis not present

## 2022-05-15 DIAGNOSIS — R293 Abnormal posture: Secondary | ICD-10-CM | POA: Diagnosis not present

## 2022-05-15 DIAGNOSIS — M25412 Effusion, left shoulder: Secondary | ICD-10-CM | POA: Diagnosis not present

## 2022-05-15 DIAGNOSIS — M25512 Pain in left shoulder: Secondary | ICD-10-CM | POA: Diagnosis not present

## 2022-05-16 DIAGNOSIS — M19012 Primary osteoarthritis, left shoulder: Secondary | ICD-10-CM | POA: Diagnosis not present

## 2022-05-17 DIAGNOSIS — M25412 Effusion, left shoulder: Secondary | ICD-10-CM | POA: Diagnosis not present

## 2022-05-17 DIAGNOSIS — M25512 Pain in left shoulder: Secondary | ICD-10-CM | POA: Diagnosis not present

## 2022-05-17 DIAGNOSIS — R293 Abnormal posture: Secondary | ICD-10-CM | POA: Diagnosis not present

## 2022-05-17 DIAGNOSIS — M25612 Stiffness of left shoulder, not elsewhere classified: Secondary | ICD-10-CM | POA: Diagnosis not present

## 2022-05-19 DIAGNOSIS — M25512 Pain in left shoulder: Secondary | ICD-10-CM | POA: Diagnosis not present

## 2022-05-19 DIAGNOSIS — M25412 Effusion, left shoulder: Secondary | ICD-10-CM | POA: Diagnosis not present

## 2022-05-19 DIAGNOSIS — R293 Abnormal posture: Secondary | ICD-10-CM | POA: Diagnosis not present

## 2022-05-19 DIAGNOSIS — M25612 Stiffness of left shoulder, not elsewhere classified: Secondary | ICD-10-CM | POA: Diagnosis not present

## 2022-05-22 ENCOUNTER — Ambulatory Visit: Payer: BC Managed Care – PPO | Admitting: Internal Medicine

## 2022-05-22 DIAGNOSIS — R293 Abnormal posture: Secondary | ICD-10-CM | POA: Diagnosis not present

## 2022-05-22 DIAGNOSIS — M25612 Stiffness of left shoulder, not elsewhere classified: Secondary | ICD-10-CM | POA: Diagnosis not present

## 2022-05-22 DIAGNOSIS — M25412 Effusion, left shoulder: Secondary | ICD-10-CM | POA: Diagnosis not present

## 2022-05-22 DIAGNOSIS — M25512 Pain in left shoulder: Secondary | ICD-10-CM | POA: Diagnosis not present

## 2022-05-24 DIAGNOSIS — M25512 Pain in left shoulder: Secondary | ICD-10-CM | POA: Diagnosis not present

## 2022-05-24 DIAGNOSIS — R293 Abnormal posture: Secondary | ICD-10-CM | POA: Diagnosis not present

## 2022-05-24 DIAGNOSIS — M25412 Effusion, left shoulder: Secondary | ICD-10-CM | POA: Diagnosis not present

## 2022-05-24 DIAGNOSIS — M25612 Stiffness of left shoulder, not elsewhere classified: Secondary | ICD-10-CM | POA: Diagnosis not present

## 2022-05-31 DIAGNOSIS — M25412 Effusion, left shoulder: Secondary | ICD-10-CM | POA: Diagnosis not present

## 2022-05-31 DIAGNOSIS — M25612 Stiffness of left shoulder, not elsewhere classified: Secondary | ICD-10-CM | POA: Diagnosis not present

## 2022-05-31 DIAGNOSIS — R293 Abnormal posture: Secondary | ICD-10-CM | POA: Diagnosis not present

## 2022-05-31 DIAGNOSIS — M25512 Pain in left shoulder: Secondary | ICD-10-CM | POA: Diagnosis not present

## 2022-06-02 DIAGNOSIS — M25412 Effusion, left shoulder: Secondary | ICD-10-CM | POA: Diagnosis not present

## 2022-06-02 DIAGNOSIS — M25512 Pain in left shoulder: Secondary | ICD-10-CM | POA: Diagnosis not present

## 2022-06-02 DIAGNOSIS — M25612 Stiffness of left shoulder, not elsewhere classified: Secondary | ICD-10-CM | POA: Diagnosis not present

## 2022-06-02 DIAGNOSIS — R293 Abnormal posture: Secondary | ICD-10-CM | POA: Diagnosis not present

## 2022-06-21 ENCOUNTER — Ambulatory Visit: Payer: BC Managed Care – PPO | Admitting: Internal Medicine

## 2022-07-19 ENCOUNTER — Ambulatory Visit: Payer: Commercial Managed Care - PPO | Attending: Internal Medicine | Admitting: Internal Medicine

## 2022-07-19 ENCOUNTER — Encounter: Payer: Self-pay | Admitting: Internal Medicine

## 2022-07-19 VITALS — BP 104/74 | HR 78 | Resp 14 | Ht 63.0 in | Wt 175.0 lb

## 2022-07-19 DIAGNOSIS — Z79899 Other long term (current) drug therapy: Secondary | ICD-10-CM

## 2022-07-19 DIAGNOSIS — R7989 Other specified abnormal findings of blood chemistry: Secondary | ICD-10-CM

## 2022-07-19 DIAGNOSIS — M4690 Unspecified inflammatory spondylopathy, site unspecified: Secondary | ICD-10-CM

## 2022-07-19 DIAGNOSIS — M5030 Other cervical disc degeneration, unspecified cervical region: Secondary | ICD-10-CM

## 2022-07-19 NOTE — Progress Notes (Unsigned)
Office Visit Note  Patient: Breanna [name]             Date of Birth: 1970-11-20           MRN: [id number]             PCP: Samuel Bouche, NP Referring: Samuel Bouche, NP Visit Date: 07/19/2022   Subjective:  Follow-up   History of Present Illness: Breanna [name] is a 52 y.o. 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 Symptoms are noticeably worse off treatment with back pain and stiffness most prominent in the morning and with prolonged sitting. Lasts at least about 30 minutes into the morning. Right side is worse than left, describes some episodic worsening of symptoms that start feeling like a cramp in her gluteal muscle area. Left shoulder surgery went well currently in physical therapy ongoing for this.  Previous HPI 02/20/22  Breanna [name] is a 52 y.o. 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  Her symptoms are not terribly worse but definitely noticed a increase in the pain and stiffness again off rinvoq.   Previous HPI 11/25/2021 Breanna [name] is a 52 y.o. 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 She feels symptoms are partially improving since starting the medication. Less stiffness and pain in the low back especially during the night and after stationary positions. She still sees some swelling in her fingers on both hands. Shoulder and knee pains are about the same. She noticed some increase in facial acne otherwise no noticeable side effects.   Previous HPI 10/12/21 Breanna [name] is a 52 y.o. 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 She has not seen much additional improvement staying on the Enbrel. Back stiffness is worst every morning. More  recently having some left shoulder and right elbow pain also but these are only stiff for less than 30 minutes in mornings. She has increased in skin peeling on fingertips of both hands she thinks this is related to washing and soap and has had similar problems before.   Previous HPI 07/14/2021 Breanna [name] is a 52 y.o. female here for follow up axial spondyloarthritis on Enbrel 50 mg [place] weekly. At last visit she had not yet seen a major change in symptoms with this treatment.  She feels symptoms are partially but noticeably improved since starting the Enbrel.  She describes nighttime awakenings from about 4 times to 3 times per night.  Her morning stiffness is decreased to about 10 minutes duration.  Low back pain is overall decreased in severity but especially the pain after waking and stationary positions.  She had a recent neck epidural treatment that helped a lot with neck pain though she still notices some stiffness to range of motion and crepitus.   Previous HPI 04/14/21 Breanna [name] is a 52 y.o. 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 She has not noticed any large change in symptoms after starting this medication. Still has morning stiffness and low back pain, some hand pain, and some night time awakenings. She reports increased left shoulder pain especially with raising her arm overhead more since the past 2-3 weeks. She had joint injection for this in July  with a good improvement until now. She also tried increasing the lyrica medication for joint pain and some cervical spine degenerative changes with possible compression, but stopped this due to feeling poorly on this and worse at higher dose.   Previous HPI 12/28/20 Breanna [name] is a 52 y.o. female here for axial and peripheral spondyloarthritis. She previously saw a rheumatologist in [place] and took Humira for this but has been off treatment for at least a year. Review of previous  rheumatology records indicates most recent treatment was on methotrexate and humira with reasonably good disease control. Apparently GI intolerance of methotrexate with titration to 25 mg PO weekly but tolerated at lower doses and labs monitoring were okay. She previously had joint involvement with pain in the lower thoracic spine, lumbar spine, right SI joint, and bilateral heels. She has previously reported joint pain dating back at least 20 years in duration. She moved from [place] due to work so has been off treatment since that time. She has noticed some increase in joint pain and stiffness but worst symptom increase is fatigue. She felt Humira was controlled symptoms well initially with loss of improvement, and addition of methotrexate did not improve much. She currently has neck and back pain mostly, at night and in the morning but also increase in neck pain during the day working. She recently underwent left shoulder injection  a month ago with improvement in pain there. Otherwise she has not taken any steroid medication recently.   Outside labs reviewed HLA-B27 pos   DMARD Hx Enbrel 2022-2023 Humira 2020-? MTX 2020-? NSAIDs - inadequate response   Review of Systems  Constitutional:  Positive for fatigue.  HENT:  Positive for mouth dryness. Negative for mouth sores.   Eyes:  Positive for dryness.  Respiratory:  Negative for shortness of breath.   Cardiovascular:  Negative for chest pain and palpitations.  Gastrointestinal:  Positive for constipation. Negative for blood in stool and diarrhea.  Endocrine: Negative for increased urination.  Genitourinary:  Negative for involuntary urination.  Musculoskeletal:  Positive for joint pain, joint pain, myalgias, morning stiffness and myalgias. Negative for gait problem, joint swelling, muscle weakness and muscle tenderness.  Skin:  Negative for color change, rash, hair loss and sensitivity to sunlight.  Allergic/Immunologic: Negative for  susceptible to infections.  Neurological:  Negative for dizziness and headaches.  Hematological:  Negative for swollen glands.  Psychiatric/Behavioral:  Positive for sleep disturbance. Negative for depressed mood. The patient is nervous/anxious.     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 02/20/2022   Lateral epicondylitis, right elbow 12/13/2021   Colon cancer screening 10/25/2021   Gastroesophageal reflux disease without esophagitis 04/14/2021   Menopause 04/14/2021   Morbid obesity ([place]) 04/14/2021   Pure hypercholesterolemia 04/14/2021   Stress incontinence (female)  04/14/2021   High risk medication use 12/28/2020   Primary osteoarthritis of right knee 12/21/2020   Morton's neuroma, right 12/21/2020   Chronic left shoulder pain 11/23/2020   DDD (degenerative disc disease), cervical 11/23/2020   Obesity, Class II, BMI 35-39.9 07/28/2020   Hyperlipidemia    Asthma    Axial spondyloarthritis ([place])    Anxiety     Past Medical History:  Diagnosis Date   Ankylosing spondylitis ([place])    Anxiety    Arthritis    Asthma    GERD (gastroesophageal reflux disease)    Hiatal hernia    Hyperlipidemia    Menopause  Shingles     Family History  Problem Relation Age of Onset   Hypertension Mother    Heart attack Father    Healthy Brother    HIV Son    Lupus Cousin    Rheum arthritis Cousin    Past Surgical History:  Procedure Laterality Date   BREAST SURGERY     CARPAL TUNNEL RELEASE     epidural steroid injection  07/05/2021   in neck   GALLBLADDER SURGERY     KNEE SURGERY     ROTATOR CUFF REPAIR Left 05/08/2022   bicep tendon tear repair   TOTAL ABDOMINAL HYSTERECTOMY     Social History   Social History Narrative   Not on file   Immunization History  Administered Date(s) Administered   Influenza,inj,Quad PF,6+ Mos 08/01/2021, 01/24/2022   Influenza-Unspecified 03/05/2020   Moderna Sars-Covid-2  Vaccination 06/12/2019, 08/22/2019, 03/30/2020   Tdap 05/05/2013     Objective: Vital Signs: BP 104/74 (BP Location: Left Arm, Patient Position: Sitting, Cuff Size: Large)   Pulse 78   Resp 14   Ht 5' 3"$  (1.6 m)   Wt 175 lb (79.4 kg)   BMI 31.00 kg/m    Physical Exam Constitutional:      Appearance: She is obese.  Cardiovascular:     Rate and Rhythm: Normal rate.  Musculoskeletal:     Right lower leg: No edema.     Left lower leg: No edema.  Lymphadenopathy:     Cervical: No cervical adenopathy.  Skin:    Findings: No rash.  Neurological:     Mental Status: She is alert.  Psychiatric:        Mood and Affect: Mood normal.      Musculoskeletal Exam:  Left shoulder limited abduction ROM reaches about 30 degrees above horizontal, internal and external rotation are good, right shoulder normal Elbows full ROM no tenderness or swelling Wrists full ROM no tenderness or swelling Fingers full ROM no tenderness or swelling Paraspinal muscle tenderness throughout upper and lower back, worse on right sided lumbar spine and over right SI joint, left SI joint nontender Knees full ROM no tenderness or swelling Ankles full ROM no tenderness or swelling  Investigation: No additional findings.  Imaging: No results found.  Recent Labs: Lab Results  Component Value Date   WBC 12.8 (H) 02/20/2022   HGB 15.6 (H) 02/20/2022   PLT 436 (H) 02/20/2022   NA 141 02/20/2022   K 4.4 02/20/2022   CL 101 02/20/2022   CO2 28 02/20/2022   GLUCOSE 101 (H) 02/20/2022   BUN 14 02/20/2022   CREATININE 0.87 02/20/2022   BILITOT 0.8 02/20/2022   ALKPHOS 91 07/01/2020   AST 37 (H) 02/20/2022   ALT 78 (H) 02/20/2022   PROT 7.2 02/20/2022   ALBUMIN 4.6 07/01/2020   CALCIUM 9.8 02/20/2022   GFRAA 87 09/16/2020   QFTBGOLDPLUS NEGATIVE 10/12/2021    Speciality Comments: No specialty comments available.  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  Previous treatments with 2 TNF inhibitors 1 Jak inhibitor and methotrexate abnormal had loss of efficacy or adverse event.  Will recheck CRP for inflammatory disease monitoring today.  Plan will be to start Cosentyx subcu which does not have a particularly high risk for affecting liver function.  High risk medication use - Plan: CBC with Differential/Platelet, COMPLETE METABOLIC PANEL WITH GFR  Rechecking baseline CBC and CMP for medication monitoring planning to start with the Cosentyx.  Most recent tuberculosis screening was May of last year and negative.  Discussed risks of medication including injection reactions increased infection risk.  She does not have any history of inflammatory bowel disease.  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  I do not think any more aggressive evaluation is needed unless trending upwards and more than several times outside normal limits.  Orders: Orders Placed This Encounter  Procedures   C-reactive protein   CBC with Differential/Platelet   COMPLETE METABOLIC PANEL WITH GFR   No orders of the defined types were placed in this encounter.    Follow-Up Instructions: Return in about 3 months (around 10/17/2022) for AS COS start f/u 77mo.   CCollier Salina MD  Note - This record has been created using [employer]  Chart creation errors have been sought, but may not always  have been located. Such creation errors do not reflect on  the standard of medical care.

## 2022-07-19 NOTE — Patient Instructions (Signed)
Secukinumab Injection What is this medication? SECUKINUMAB (sek ue KIN ue mab) treats autoimmune conditions, such as psoriasis and arthritis. It works by slowing down an overactive immune system. It is a monoclonal antibody. This medicine may be used for other purposes; ask your health care provider or pharmacist if you have questions. COMMON BRAND NAME(S): Cosentyx What should I tell my care team before I take this medication? They need to know if you have any of these conditions: Crohn's disease, ulcerative colitis, or other inflammatory bowel disease Immune system problems Infection or history of infection, such as a viral infection, chickenpox, cold sores, or herpes Recently received or are scheduled to receive a vaccine Tuberculosis, a positive skin test for tuberculosis, or recent close contact with someone who has tuberculosis An unusual or allergic reaction to secukinumab, latex, rubber, other medications, foods, dyes, or preservatives Pregnant or trying to get pregnant Breast-feeding How should I use this medication? This medication is injected under the skin. It can be given by your care team in a hospital or clinic setting. It may also be given at home. If you get this medication at home, you will be taught how to prepare and give it. Use it exactly as directed. Take it as directed on the prescription label. Keep taking it unless your care team tells you to stop. It is important that you put your used needles and syringes in a special sharps container. Do not put them in a trash can. If you do not have a sharps container, call your pharmacist or care team to get one. A special MedGuide will be given to you by the pharmacist with each prescription and refill. Be sure to read this information carefully each time. Talk to your care team about the use of this medication in children. While it may be prescribed for children as young as 2 years for selected conditions, precautions do  apply. Overdosage: If you think you have taken too much of this medicine contact a poison control center or emergency room at once. NOTE: This medicine is only for you. Do not share this medicine with others. What if I miss a dose? If you get this medication at the hospital or clinic: It is important not to miss your dose. Call your care team if you are unable to keep an appointment. If you give yourself this medication at home: If you miss a dose, take it as soon as you can. If it is almost time for your next dose, take only that dose. Do not take double or extra doses. Call your care team with questions. What may interact with this medication? Live virus vaccines This list may not describe all possible interactions. Give your health care provider a list of all the medicines, herbs, non-prescription drugs, or dietary supplements you use. Also tell them if you smoke, drink alcohol, or use illegal drugs. Some items may interact with your medicine. What should I watch for while using this medication? Visit your care team for regular checks on your progress. Tell your care team if your symptoms do not start to get better or if they get worse. You will be tested for tuberculosis (TB) before you start this medication. If your care team prescribes any medication for TB, you should start taking the TB medication before starting this medication. Make sure to finish the full course of TB medication. This medication may increase your risk of getting an infection. Call your care team for advice if you get a fever,  chills, sore throat, or other symptoms of a cold or flu. Do not treat yourself. Try to avoid being around people who are sick. This medication can decrease the response to a vaccine. If you need to get vaccinated, tell your care team if you have received this medication within the last 6 months. Extra booster doses may be needed. Talk to your care team to see if a different vaccination schedule is  needed. What side effects may I notice from receiving this medication? Side effects that you should report to your care team as soon as possible: Allergic reactions--skin rash, itching, hives, swelling of the face, lips, tongue, or throat Dry, itchy, scaly patches of skin that blister or peel Infection--fever, chills, cough, sore throat, wounds that don't heal, pain or trouble when passing urine, general feeling of discomfort or being unwell Sudden or severe stomach pain, bloody diarrhea, fever, nausea, vomiting Side effects that usually do not require medical attention (report these to your care team if they continue or are bothersome): Diarrhea Runny or stuffy nose Sore throat This list may not describe all possible side effects. Call your doctor for medical advice about side effects. You may report side effects to FDA at 1-800-FDA-1088. Where should I keep my medication? Keep out of the reach of children and pets. Store in the refrigerator. Do not freeze. Keep it in the original carton until you are ready to use it. Protect from light. Do not shake. Remove the dose from the refrigerator about 30 minutes before it is time for you to use it. Use it within 4 days of removing it from the carton. Get rid of any unused medication after the expiration date. To get rid of medications that are no longer needed or have expired: Take the medication to a medication take-back program. Check with your pharmacy or law enforcement to find a location. If you cannot return the medication, ask your pharmacist or care team how to get rid of this medication safely. NOTE: This sheet is a summary. It may not cover all possible information. If you have questions about this medicine, talk to your doctor, pharmacist, or health care provider.  2023 Elsevier/Gold Standard (2013-06-26 00:00:00)

## 2022-07-20 ENCOUNTER — Telehealth: Payer: Self-pay | Admitting: Pharmacist

## 2022-07-20 LAB — COMPLETE METABOLIC PANEL WITH GFR
AG Ratio: 2 (calc) (ref 1.0–2.5)
ALT: 26 U/L (ref 6–29)
AST: 22 U/L (ref 10–35)
Albumin: 4.7 g/dL (ref 3.6–5.1)
Alkaline phosphatase (APISO): 75 U/L (ref 37–153)
BUN: 11 mg/dL (ref 7–25)
CO2: 28 mmol/L (ref 20–32)
Calcium: 9.7 mg/dL (ref 8.6–10.4)
Chloride: 104 mmol/L (ref 98–110)
Creat: 0.69 mg/dL (ref 0.50–1.03)
Globulin: 2.3 g/dL (calc) (ref 1.9–3.7)
Glucose, Bld: 91 mg/dL (ref 65–99)
Potassium: 4 mmol/L (ref 3.5–5.3)
Sodium: 140 mmol/L (ref 135–146)
Total Bilirubin: 0.7 mg/dL (ref 0.2–1.2)
Total Protein: 7 g/dL (ref 6.1–8.1)
eGFR: 105 mL/min/{1.73_m2} (ref >=60)

## 2022-07-20 LAB — CBC WITH DIFFERENTIAL/PLATELET
Absolute Monocytes: 437 cells/uL (ref 200–950)
Basophils Absolute: 17 cells/uL (ref 0–200)
Basophils Relative: 0.2 %
Eosinophils Absolute: 168 cells/uL (ref 15–500)
Eosinophils Relative: 2 %
HCT: 42.5 % (ref 35.0–45.0)
Hemoglobin: 14.5 g/dL (ref 11.7–15.5)
Lymphs Abs: 3528 cells/uL (ref 850–3900)
MCH: 28.8 pg (ref 27.0–33.0)
MCHC: 34.1 g/dL (ref 32.0–36.0)
MCV: 84.3 fL (ref 80.0–100.0)
MPV: 9.8 fL (ref 7.5–12.5)
Monocytes Relative: 5.2 %
Neutro Abs: 4250 cells/uL (ref 1500–7800)
Neutrophils Relative %: 50.6 %
Platelets: 331 10*3/uL (ref 140–400)
RBC: 5.04 10*6/uL (ref 3.80–5.10)
RDW: 13 % (ref 11.0–15.0)
Total Lymphocyte: 42 %
WBC: 8.4 10*3/uL (ref 3.8–10.8)

## 2022-07-20 LAB — C-REACTIVE PROTEIN: CRP: 0.7 mg/L (ref <8.0)

## 2022-07-20 NOTE — Progress Notes (Signed)
CBC and CMP are normal so liver enzymes improved compared to last year. No problems for starting cosentyx.

## 2022-07-20 NOTE — Telephone Encounter (Addendum)
Submitted a Prior Authorization request to [facility] for COSENTYX via CoverMyMeds. Will update once we receive a response.  Key: [id number]  Dose: With a loading dose: 150 mg at weeks 0, 1, 2, 3, and 4 followed by 150 mg every 4 weeks.  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 She had loss of efficacy on Humira and Enbrel, rinvoq worked but saw LFT elevations that are now resolved. Thanks.

## 2022-07-21 ENCOUNTER — Other Ambulatory Visit (HOSPITAL_COMMUNITY): Payer: Self-pay

## 2022-07-21 NOTE — Telephone Encounter (Signed)
Received notification from [facility] regarding a prior authorization for [place]. Authorization has been APPROVED from 07/20/2022 to 07/21/2023. Approval letter sent to scan center.  Unable to run test claim because patient must fill through [facility]: [phone]. She is [employer] eligible.  Authorization #  [id number]  Enrolled patient into Cosentyx copay card: BIN: [id number] PCN: [id number] GROUP: [id number] ID: [id number]  Copay card phone number: [phone]  Patient can be scheduled for new start visit (if needed) - will discuss with Dr. Tristan Schroeder, PharmD, MPH, BCPS, CPP Clinical Pharmacist (Rheumatology and Pulmonology)

## 2022-07-24 NOTE — Telephone Encounter (Signed)
Per Dr. Benjamine Mola, patient will need to start Cosentyx in clinic  Breanna [name], PharmD, MPH, BCPS, CPP Clinical Pharmacist (Rheumatology and Pulmonology)

## 2022-07-24 NOTE — Telephone Encounter (Signed)
Patient scheduled for Cosentyx new start on 08/02/2022. Will use sample.  Knox Saliva, PharmD, MPH, BCPS, CPP Clinical Pharmacist (Rheumatology and Pulmonology)

## 2022-07-27 ENCOUNTER — Ambulatory Visit: Payer: BC Managed Care – PPO | Admitting: Medical-Surgical

## 2022-08-02 ENCOUNTER — Ambulatory Visit: Payer: Commercial Managed Care - PPO | Admitting: Pharmacist

## 2022-08-03 NOTE — Telephone Encounter (Signed)
Pt r/s Cosentyx new start appt for 08/15/2022  Knox Saliva, PharmD, MPH, BCPS, CPP Clinical Pharmacist (Rheumatology and Pulmonology)

## 2022-08-08 ENCOUNTER — Encounter: Payer: Self-pay | Admitting: Medical-Surgical

## 2022-08-08 ENCOUNTER — Ambulatory Visit: Payer: Commercial Managed Care - PPO | Admitting: Medical-Surgical

## 2022-08-08 VITALS — BP 101/69 | HR 99 | Resp 20 | Ht 63.0 in | Wt 175.4 lb

## 2022-08-08 DIAGNOSIS — F419 Anxiety disorder, unspecified: Secondary | ICD-10-CM

## 2022-08-08 DIAGNOSIS — N951 Menopausal and female climacteric states: Secondary | ICD-10-CM | POA: Insufficient documentation

## 2022-08-08 DIAGNOSIS — Z7689 Persons encountering health services in other specified circumstances: Secondary | ICD-10-CM

## 2022-08-08 DIAGNOSIS — R079 Chest pain, unspecified: Secondary | ICD-10-CM | POA: Insufficient documentation

## 2022-08-08 MED ORDER — ROSUVASTATIN CALCIUM 40 MG PO TABS: 40.0000 mg | ORAL_TABLET | Freq: Every day | ORAL | 3 refills | Status: DC

## 2022-08-08 MED ORDER — SEMAGLUTIDE (2 MG/DOSE) 8 MG/3ML ~~LOC~~ SOPN: 2.0000 mg | PEN_INJECTOR | SUBCUTANEOUS | 1 refills | Status: DC

## 2022-08-08 MED ORDER — DULOXETINE HCL 60 MG PO CPEP: 60.0000 mg | ORAL_CAPSULE | Freq: Every day | ORAL | 1 refills | Status: DC

## 2022-08-08 NOTE — Progress Notes (Signed)
   Established Patient Office Visit  Subjective   Patient ID: Breanna [name], female   DOB: 1970-11-29 Age: 52 y.o. MRN: [id number]   Chief Complaint  Patient presents with   Follow-up    MOOD   HPI Pleasant 52 year old female presenting today for the following:  Mood: Taking Cymbalta 60 mg daily, tolerating well without side effects.  Has been able to reduce to 1 job and is no longer working at [employer].  She is now working with a Copywriter, advertising and feels like she is more able to be herself and not have to worry so much about certain behaviors such as slips in foul language.   Weight: Currently using Ozempic 1 mg weekly, tolerating well without side effects.  Has done well but feels like she is at a plateau.  Interested in increasing her dose.   Objective:    Vitals:   08/08/22 1346  BP: 101/69  Pulse: 99  Resp: 20  Height: '5\' 3"'$  (1.6 m)  Weight: 175 lb 6.4 oz (79.6 kg)  SpO2: 96%  BMI (Calculated): 31.08   Physical Exam Vitals reviewed.  Constitutional:      General: She is not in acute distress.    Appearance: Normal appearance. She is not ill-appearing.  HENT:     Head: Normocephalic and atraumatic.  Cardiovascular:     Rate and Rhythm: Normal rate and regular rhythm.     Pulses: Normal pulses.     Heart sounds: Normal heart sounds.  Pulmonary:     Effort: Pulmonary effort is normal. No respiratory distress.     Breath sounds: Normal breath sounds. No wheezing, rhonchi or rales.  Skin:    General: Skin is warm and dry.  Neurological:     Mental Status: She is alert and oriented to person, place, and time.  Psychiatric:        Mood and Affect: Mood normal.        Behavior: Behavior normal.        Thought Content: Thought content normal.        Judgment: Judgment normal.   No results found for this or any previous visit (from the past 24 hour(s)).     The 10-year ASCVD risk score (Arnett DK, et al., 2019) is: 0.5%   Values used to calculate the  score:     Age: 32 years     Sex: Female     Is Non-Hispanic African American: No     Diabetic: No     Tobacco smoker: No     Systolic Blood Pressure: 99991111 mmHg     Is BP treated: No     HDL Cholesterol: 62 mg/dL     Total Cholesterol: 149 mg/dL   Assessment & Plan:   1. Anxiety Stable on current regimen.  Continue Cymbalta 60 mg daily. - DULoxetine (CYMBALTA) 60 MG capsule; Take 1 capsule (60 mg total) by mouth daily.  Dispense: 90 capsule; Refill: 1  2. Encounter for weight management Has become stable and although not gaining, also not losing.  Increasing Ozempic to 2 mg weekly.  Recommend continuing dietary modification and exercise at least 3 times weekly.  Return for annual physical exam at your convenience.  ___________________________________________ Clearnce Sorrel, DNP, APRN, FNP-BC [facility] [place]

## 2022-08-15 ENCOUNTER — Ambulatory Visit: Payer: Commercial Managed Care - PPO | Admitting: Pharmacist

## 2022-08-15 NOTE — Progress Notes (Deleted)
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 Patient currently takes ***.  Patient running a fever or have signs/symptoms of infection? {yes/no:20286}  Patient currently on antibiotics for the treatment of infection? {yes/no:20286}  Patient have any upcoming invasive procedures/surgeries? {yes/no:20286}  Objective: CMP     Component Value Date/Time   NA 140 07/19/2022 1626   NA 142 07/01/2020 0000   K 4.0 07/19/2022 1626   CL 104 07/19/2022 1626   CO2 28 07/19/2022 1626   GLUCOSE 91 07/19/2022 1626   BUN 11 07/19/2022 1626   CREATININE 0.69 07/19/2022 1626   CALCIUM 9.7 07/19/2022 1626   PROT 7.0 07/19/2022 1626   ALBUMIN 4.6 07/01/2020 0000   AST 22 07/19/2022 1626   ALT 26 07/19/2022 1626   ALKPHOS 91 07/01/2020 0000   BILITOT 0.7 07/19/2022 1626   GFRNONAA 75 09/16/2020 0000   GFRAA 87 09/16/2020 0000    CBC    Component Value Date/Time   WBC 8.4 07/19/2022 1626   RBC 5.04 07/19/2022 1626   HGB 14.5 07/19/2022 1626   HCT 42.5 07/19/2022 1626   PLT 331 07/19/2022 1626   MCV 84.3 07/19/2022 1626   MCH 28.8 07/19/2022 1626   MCHC 34.1 07/19/2022 1626   RDW 13.0 07/19/2022 1626   LYMPHSABS 3,528 07/19/2022 1626   EOSABS 168 07/19/2022 1626   BASOSABS 17 07/19/2022 1626    Baseline Immunosuppressant Therapy Labs TB GOLD    Latest Ref Rng & Units 10/12/2021   11:29 AM  Quantiferon TB Gold  Quantiferon TB Gold Plus NEGATIVE NEGATIVE    Hepatitis Panel    Latest Ref Rng & Units 12/28/2020    9:56 AM  Hepatitis  Hep B Surface Ag NON-REACTIVE NON-REACTIVE   Hep B IgM NON-REACTIVE NON-REACTIVE   Hep C Ab NON-REACTIVE NON-REACTIVE    HIV Lab Results  Component Value Date   HIV NON-REACTIVE 12/28/2020   Immunoglobulins   SPEP    Latest Ref Rng & Units 07/19/2022    4:26 PM  Serum Protein Electrophoresis  Total Protein 6.1 - 8.1 g/dL 7.0    Chest x-ray:  ***  Assessment/Plan:  Reviewed importance of holding COSENTYX with signs/symptoms of an infections, if antibiotics are prescribed to treat an active infection, and with invasive procedures  Demonstrated proper injection technique with COSENTYX demo device  Patient able to demonstrate proper injection technique using the teach back method.  Patient self injected in the {injsitedsg:28167} with:  Sample Medication: COSENTYX SENSOREADY '150mg'$ /mL pen NDC: *** Lot: *** Expiration: ***  Patient tolerated well.  Observed for 30 mins in office for adverse reaction and ***.   Patient is to return in 1 month for labs and 6-8 weeks for follow-up appointment.  Standing orders for CBC/CMP and *** placed.  TB gold will be monitored yearly.   COSENTYX approved through insurance .   [facility]: [phone].  Patient provided with pharmacy phone number and advised to call later this week to schedule shipment to home.  Patient will continue COSENTYX '150mg'$  subcut at Week 0 (completed in clinic today), Week 1, 2, 3, 4 then '150mg'$  subcut every 4 weeks.  All questions encouraged and answered.  Instructed patient to call with any further questions or concerns.  Knox Saliva, PharmD, MPH, BCPS, CPP Clinical Pharmacist (Rheumatology and Pulmonology)  08/15/2022 8:04 AM

## 2022-08-15 NOTE — Telephone Encounter (Signed)
Pt r/s Cosentyx new start to 08/22/22  Knox Saliva, PharmD, MPH, BCPS, CPP Clinical Pharmacist (Rheumatology and Pulmonology)

## 2022-08-21 NOTE — Progress Notes (Deleted)
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 Previously trialed Rinvoq, however, this was discontinued d/t abnormal LFT changes  Patient running a fever or have signs/symptoms of infection? {yes/no:20286}  Patient currently on antibiotics for the treatment of infection? {yes/no:20286}  Patient have any upcoming invasive procedures/surgeries? {yes/no:20286}  Objective: CMP     Component Value Date/Time   NA 140 07/19/2022 1626   NA 142 07/01/2020 0000   K 4.0 07/19/2022 1626   CL 104 07/19/2022 1626   CO2 28 07/19/2022 1626   GLUCOSE 91 07/19/2022 1626   BUN 11 07/19/2022 1626   CREATININE 0.69 07/19/2022 1626   CALCIUM 9.7 07/19/2022 1626   PROT 7.0 07/19/2022 1626   ALBUMIN 4.6 07/01/2020 0000   AST 22 07/19/2022 1626   ALT 26 07/19/2022 1626   ALKPHOS 91 07/01/2020 0000   BILITOT 0.7 07/19/2022 1626   GFRNONAA 75 09/16/2020 0000   GFRAA 87 09/16/2020 0000    CBC    Component Value Date/Time   WBC 8.4 07/19/2022 1626   RBC 5.04 07/19/2022 1626   HGB 14.5 07/19/2022 1626   HCT 42.5 07/19/2022 1626   PLT 331 07/19/2022 1626   MCV 84.3 07/19/2022 1626   MCH 28.8 07/19/2022 1626   MCHC 34.1 07/19/2022 1626   RDW 13.0 07/19/2022 1626   LYMPHSABS 3,528 07/19/2022 1626   EOSABS 168 07/19/2022 1626   BASOSABS 17 07/19/2022 1626    Baseline Immunosuppressant Therapy Labs TB GOLD    Latest Ref Rng & Units 10/12/2021   11:29 AM  Quantiferon TB Gold  Quantiferon TB Gold Plus NEGATIVE NEGATIVE    Hepatitis Panel    Latest Ref Rng & Units 12/28/2020    9:56 AM  Hepatitis  Hep B Surface Ag NON-REACTIVE NON-REACTIVE   Hep B IgM NON-REACTIVE NON-REACTIVE   Hep C Ab NON-REACTIVE NON-REACTIVE    HIV Lab Results  Component Value Date   HIV NON-REACTIVE 12/28/2020   Immunoglobulins   SPEP    Latest Ref Rng & Units 07/19/2022    4:26 PM  Serum Protein Electrophoresis  Total Protein  6.1 - 8.1 g/dL 7.0    G6PD No results found for: "G6PDH" TPMT No results found for: "TPMT"    Assessment/Plan:  Reviewed importance of holding Cosentyx with signs/symptoms of an infections, if antibiotics are prescribed to treat an active infection, and with invasive procedures  Demonstrated proper injection technique with Cosentyx demo device  Patient able to demonstrate proper injection technique using the teach back method.  Patient self injected in the {injsitedsg:28167} with:  Sample Medication: *** NDC: *** Lot: *** Expiration: ***  Patient tolerated well.  Observed for 30 mins in office for adverse reaction and ***.   Patient is to return in 1 month for labs and 6-8 weeks for follow-up appointment.  Standing orders for CBC/CMP and *** placed.  TB gold will be monitored yearly.  Referral to *** Dermatology placed today for yearly skin checks while on TNF inhibitor due to risk for non melanoma skin cancer  Cosentyx approved through insurance .   [facility]: [phone].  Patient provided with pharmacy phone number and advised to call later this week to schedule shipment to home.  Patient will continue Cosentyx 150 mg subcutaneously at weeks 0, 1, 2, 3, and 4 followed by 150 mg every 4 weeks.  All questions encouraged and answered.  Instructed patient to call  with any further questions or concerns.  Maryan Puls, PharmD PGY-1 [facility] Pharmacy Resident

## 2022-08-22 ENCOUNTER — Ambulatory Visit: Payer: Commercial Managed Care - PPO | Admitting: Pharmacist

## 2022-08-23 ENCOUNTER — Ambulatory Visit: Payer: Commercial Managed Care - PPO | Attending: Internal Medicine | Admitting: Pharmacist

## 2022-08-23 DIAGNOSIS — Z111 Encounter for screening for respiratory tuberculosis: Secondary | ICD-10-CM

## 2022-08-23 DIAGNOSIS — Z79899 Other long term (current) drug therapy: Secondary | ICD-10-CM

## 2022-08-23 DIAGNOSIS — M4690 Unspecified inflammatory spondylopathy, site unspecified: Secondary | ICD-10-CM

## 2022-08-23 DIAGNOSIS — Z7189 Other specified counseling: Secondary | ICD-10-CM

## 2022-08-23 MED ORDER — COSENTYX SENSOREADY PEN 150 MG/ML ~~LOC~~ SOAJ: SUBCUTANEOUS | 0 refills | Status: DC

## 2022-08-23 MED ORDER — COSENTYX SENSOREADY PEN 150 MG/ML ~~LOC~~ SOAJ: SUBCUTANEOUS | 2 refills | Status: DC

## 2022-08-23 NOTE — Patient Instructions (Signed)
Your next COSENTYX dose is due on 08/30/22, 09/06/22, 09/13/22, 09/20/22, and every 4 weeks thereafter starting on 10/18/22  [place] if you have signs or symptoms of an infection. You can resume once you feel better or back to your baseline. HOLD COSENTYX if you start antibiotics to treat an infection. HOLD COSENTYX around the time of surgery/procedures. Your surgeon will be able to provide recommendations on when to hold BEFORE and when you are cleared to [place].  Pharmacy information: Your prescription [facility] shipped from [facility]. Their phone number is [phone] Please call to schedule shipment and confirm address. They will mail your medication to your home.  Cost information: Your copay should be affordable. If you call the pharmacy and it is not affordable, please double-check that they are billing through your copay card as secondary coverage. That copay card information is: BIN: [id number] PCN: [id number] GROUP: [id number] ID: [id number]  Labs are due in 1 month then every 3 months. Lab hours are from Monday to Thursday 8am-12:30pm and 1pm-5pm and Friday 8am-12pm. You do not need an appointment if you come for labs during these times.  How to manage an injection site reaction: Remember the 5 C's: COUNTER - leave on the counter at least 30 minutes but up to overnight to bring medication to room temperature. This may help prevent stinging COLD - place something cold (like an ice gel pack or cold water bottle) on the injection site just before cleansing with alcohol. This may help reduce pain CLARITIN - use Claritin (generic name is loratadine) for the first two weeks of treatment or the day of, the day before, and the day after injecting. This will help to minimize injection site reactions CORTISONE CREAM - apply if injection site is irritated and itching CALL ME - if injection site reaction is bigger than the size of your fist, looks infected, blisters, or if you develop  hives

## 2022-08-23 NOTE — Progress Notes (Signed)
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 Previously on Rinvoq but discontinued due to elevated LFTs and waning response to both Enbrel and Humira prior  Patient running a fever or have signs/symptoms of infection? No  Patient currently on antibiotics for the treatment of infection? No  Patient have any upcoming invasive procedures/surgeries? No  Objective: CMP     Component Value Date/Time   NA 140 07/19/2022 1626   NA 142 07/01/2020 0000   K 4.0 07/19/2022 1626   CL 104 07/19/2022 1626   CO2 28 07/19/2022 1626   GLUCOSE 91 07/19/2022 1626   BUN 11 07/19/2022 1626   CREATININE 0.69 07/19/2022 1626   CALCIUM 9.7 07/19/2022 1626   PROT 7.0 07/19/2022 1626   ALBUMIN 4.6 07/01/2020 0000   AST 22 07/19/2022 1626   ALT 26 07/19/2022 1626   ALKPHOS 91 07/01/2020 0000   BILITOT 0.7 07/19/2022 1626   GFRNONAA 75 09/16/2020 0000   GFRAA 87 09/16/2020 0000    CBC    Component Value Date/Time   WBC 8.4 07/19/2022 1626   RBC 5.04 07/19/2022 1626   HGB 14.5 07/19/2022 1626   HCT 42.5 07/19/2022 1626   PLT 331 07/19/2022 1626   MCV 84.3 07/19/2022 1626   MCH 28.8 07/19/2022 1626   MCHC 34.1 07/19/2022 1626   RDW 13.0 07/19/2022 1626   LYMPHSABS 3,528 07/19/2022 1626   EOSABS 168 07/19/2022 1626   BASOSABS 17 07/19/2022 1626    Baseline Immunosuppressant Therapy Labs TB GOLD    Latest Ref Rng & Units 10/12/2021   11:29 AM  Quantiferon TB Gold  Quantiferon TB Gold Plus NEGATIVE NEGATIVE    Hepatitis Panel    Latest Ref Rng & Units 12/28/2020    9:56 AM  Hepatitis  Hep B Surface Ag NON-REACTIVE NON-REACTIVE   Hep B IgM NON-REACTIVE NON-REACTIVE   Hep C Ab NON-REACTIVE NON-REACTIVE    HIV Lab Results  Component Value Date   HIV NON-REACTIVE 12/28/2020   Immunoglobulins   SPEP    Latest Ref Rng & Units 07/19/2022    4:26 PM  Serum Protein Electrophoresis  Total  Protein 6.1 - 8.1 g/dL 7.0    Assessment/Plan:  Reviewed importance of holding COSENTYX Sensoready with signs/symptoms of an infections, if antibiotics are prescribed to treat an active infection, and with invasive procedures  Demonstrated proper injection technique with COSENTYX demo device  Patient able to demonstrate proper injection technique using the teach back method.  Patient self injected in the right lower abdomen with:  Sample Medication: Cosentyx Sensoready 150mg /mL NDC: [id number] Lot: [id number] Expiration: 05/2023  Patient tolerated well.  Observed for 30 mins in office for adverse reaction and none noted. Patient denies itchiness and irritation   Patient is to return in 1 month for labs and 6-8 weeks for follow-up appointment.  Standing orders for CBC/CMP placed.  TB gold will be monitored yearly.   COSENTYX approved through insurance .   [facility]: [phone].  Patient provided with pharmacy phone number and advised to call later this week to schedule shipment to home.  Patient will continue Cosentyx 150 mg subcut at weeks 0 (administered in clinic today), weeks 1, 2, 3, and 4 followed by 150 mg every 4 weeks.   All questions encouraged and answered.  Instructed patient to call with any further questions or concerns.  Knox Saliva, PharmD, MPH, BCPS,  CPP Clinical Pharmacist (Rheumatology and Pulmonology)  08/23/2022 8:52 AM

## 2022-08-31 MED ORDER — COSENTYX SENSOREADY PEN 150 MG/ML ~~LOC~~ SOAJ: SUBCUTANEOUS | 2 refills | Status: DC

## 2022-08-31 NOTE — Addendum Note (Signed)
Addended by: Cassandria Anger on: 08/31/2022 02:50 PM   Modules accepted: Orders

## 2022-09-05 ENCOUNTER — Encounter: Payer: Self-pay | Admitting: Internal Medicine

## 2022-09-06 ENCOUNTER — Telehealth: Payer: Self-pay | Admitting: Pharmacist

## 2022-09-06 NOTE — Telephone Encounter (Signed)
Received message from patient that pharmacy has been unable to run Cosentyx for loading dose. Per [facility], the authorization for loading dose expired on 08/17/22. [employer]. - they have extended authorization for loading dose through 09/13/2022.  [facility] and they were able to successfully reprocess claim for loading dose. Patient has been advised to call pharmacy back to schedule shipment to home.  Phone: [phone]  Knox Saliva, PharmD, MPH, BCPS, CPP Clinical Pharmacist (Rheumatology and Pulmonology)

## 2022-09-18 ENCOUNTER — Encounter: Payer: Self-pay | Admitting: *Deleted

## 2022-10-17 ENCOUNTER — Ambulatory Visit: Payer: Commercial Managed Care - PPO | Admitting: Internal Medicine

## 2022-10-25 ENCOUNTER — Ambulatory Visit: Payer: Commercial Managed Care - PPO | Admitting: Internal Medicine

## 2022-11-06 ENCOUNTER — Ambulatory Visit: Payer: Commercial Managed Care - PPO | Admitting: Internal Medicine

## 2022-11-15 ENCOUNTER — Encounter: Payer: Self-pay | Admitting: Internal Medicine

## 2022-11-15 ENCOUNTER — Ambulatory Visit: Payer: Commercial Managed Care - PPO | Attending: Internal Medicine | Admitting: Internal Medicine

## 2022-11-15 VITALS — BP 121/81 | HR 76 | Resp 14 | Ht 63.0 in | Wt 165.0 lb

## 2022-11-15 DIAGNOSIS — M25552 Pain in left hip: Secondary | ICD-10-CM | POA: Insufficient documentation

## 2022-11-15 DIAGNOSIS — M4690 Unspecified inflammatory spondylopathy, site unspecified: Secondary | ICD-10-CM | POA: Diagnosis not present

## 2022-11-15 DIAGNOSIS — Z79899 Other long term (current) drug therapy: Secondary | ICD-10-CM | POA: Diagnosis not present

## 2022-11-15 NOTE — Progress Notes (Addendum)
Office Visit Note  Patient: Breanna [name]             Date of Birth: 1970/11/22           MRN: [id number]             PCP: Christen Butter, NP Referring: Christen Butter, NP Visit Date: 11/15/2022   Subjective:  Follow-up   History of Present Illness: Breanna [name] is a 52 y.o. 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  Since starting medicine she has not seen a significant improvement in joint symptoms.  Has low back pain does wake her up at night sometimes multiple times and with some prolonged morning stiffness or stiffness after sitting.  Still has pain in her neck and shoulders but the low back pain is now worse than these.  Gets some benefit with Advil taking about 2 daily. She completed left shoulder PT and is doing well still has some soreness.  Previous HPI 07/19/22 Breanna [name] is a 52 y.o. 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 Symptoms are noticeably worse off treatment with back pain and stiffness most prominent in the morning and with prolonged sitting. Lasts at least about 30 minutes into the morning. Right side is worse than left, describes some episodic worsening of symptoms that start feeling like a cramp in her gluteal muscle area. Left shoulder surgery went well currently in physical therapy ongoing for this.   Previous HPI 02/20/22  Breanna [name] is a 52 y.o. 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  Her symptoms are not terribly worse but definitely noticed a increase in the pain and stiffness again off rinvoq.   Previous HPI 11/25/2021 Breanna [name] is a 52 y.o. 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  She feels symptoms are partially improving since starting the medication. Less stiffness and pain in the low back especially during the night and after stationary positions. She still sees some swelling in her fingers on both hands. Shoulder and knee pains are about the same. She noticed some increase in facial acne otherwise no noticeable side effects.   Previous HPI 10/12/21 Breanna [name] is a 52 y.o. female here for follow up for axial spondyloarthritis on Enbrel 50 mg [place] weekly. She has not seen much additional improvement staying on the Enbrel. Back stiffness is worst every morning. More recently having some left shoulder and right elbow pain also but these are only stiff for less than 30 minutes in mornings. She has increased in skin peeling on fingertips of both hands she thinks this is related to washing and soap and has had similar problems before.   Previous HPI 07/14/2021 Breanna [name] is a 52 y.o. female here for follow up axial spondyloarthritis on Enbrel 50 mg [place] weekly. At last visit she had not yet seen a major change in symptoms with this treatment.  She feels symptoms are partially but noticeably improved since starting the Enbrel.  She describes nighttime awakenings from about 4 times to 3 times per night.  Her morning stiffness is decreased to about 10 minutes duration.  Low back pain is overall decreased in severity but especially the pain after waking and stationary  positions.  She had a recent neck epidural treatment that helped a lot with neck pain though she still notices some stiffness to range of motion and crepitus.   Previous HPI 04/14/21 Breanna [name] is a 52 y.o. 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 She has not noticed any large change in symptoms after starting this medication. Still has morning stiffness and low back pain, some hand pain, and some night time awakenings. She reports increased left shoulder pain especially  with raising her arm overhead more since the past 2-3 weeks. She had joint injection for this in July with a good improvement until now. She also tried increasing the lyrica medication for joint pain and some cervical spine degenerative changes with possible compression, but stopped this due to feeling poorly on this and worse at higher dose.   Previous HPI 12/28/20 Breanna [name] is a 52 y.o. female here for axial and peripheral spondyloarthritis. She previously saw a rheumatologist in [place] and took Humira for this but has been off treatment for at least a year. Review of previous rheumatology records indicates most recent treatment was on methotrexate and humira with reasonably good disease control. Apparently GI intolerance of methotrexate with titration to 25 mg PO weekly but tolerated at lower doses and labs monitoring were okay. She previously had joint involvement with pain in the lower thoracic spine, lumbar spine, right SI joint, and bilateral heels. She has previously reported joint pain dating back at least 20 years in duration. She moved from [place] due to work so has been off treatment since that time. She has noticed some increase in joint pain and stiffness but worst symptom increase is fatigue. She felt Humira was controlled symptoms well initially with loss of improvement, and addition of methotrexate did not improve much. She currently has neck and back pain mostly, at night and in the morning but also increase in neck pain during the day working. She recently underwent left shoulder injection  a month ago with improvement in pain there. Otherwise she has not taken any steroid medication recently.   Outside labs reviewed HLA-B27 pos   DMARD Hx Enbrel 2022-2023 Humira 2020-? MTX 2020-? NSAIDs - inadequate response   Review of Systems  Constitutional:  Positive for fatigue.  HENT:  Negative for mouth sores and mouth dryness.   Eyes:  Positive for dryness.  Respiratory:   Negative for shortness of breath.   Cardiovascular:  Negative for chest pain and palpitations.  Gastrointestinal:  Negative for blood in stool, constipation and diarrhea.  Endocrine: Negative for increased urination.  Genitourinary:  Negative for involuntary urination.  Musculoskeletal:  Positive for joint pain, joint pain, myalgias, morning stiffness, muscle tenderness and myalgias. Negative for gait problem, joint swelling and muscle weakness.  Skin:  Positive for sensitivity to sunlight. Negative for color change, rash and hair loss.  Allergic/Immunologic: Negative for susceptible to infections.  Neurological:  Negative for dizziness and headaches.  Hematological:  Negative for swollen glands.  Psychiatric/Behavioral:  Positive for sleep disturbance. Negative for depressed mood. The patient is nervous/anxious.     PMFS History:  Patient Active Problem List   Diagnosis Date Noted   Pain in left hip 11/15/2022   Liver function test abnormality 02/20/2022   Lateral epicondylitis, right elbow 12/13/2021   Gastroesophageal reflux disease without esophagitis 04/14/2021   Menopause 04/14/2021   Morbid obesity (HCC) 04/14/2021   Pure hypercholesterolemia 04/14/2021   Stress incontinence (female)  04/14/2021  High risk medication use 12/28/2020   Primary osteoarthritis of right knee 12/21/2020   Morton's neuroma, right 12/21/2020   Chronic left shoulder pain 11/23/2020   DDD (degenerative disc disease), cervical 11/23/2020   Hyperlipidemia    Asthma    Axial spondyloarthritis (HCC)    Anxiety     Past Medical History:  Diagnosis Date   Ankylosing spondylitis (HCC)    Anxiety    Arthritis    Asthma    GERD (gastroesophageal reflux disease)    Hiatal hernia    Hyperlipidemia    Menopause    Shingles     Family History  Problem Relation Age of Onset   Hypertension Mother    Heart attack Father    Healthy Brother    HIV Son    Lupus Cousin    Rheum arthritis Cousin     Past Surgical History:  Procedure Laterality Date   BREAST SURGERY     CARPAL TUNNEL RELEASE     epidural steroid injection  07/05/2021   in neck   GALLBLADDER SURGERY     KNEE SURGERY     ROTATOR CUFF REPAIR Left 05/08/2022   bicep tendon tear repair   TOTAL ABDOMINAL HYSTERECTOMY     Social History   Social History Narrative   Not on file   Immunization History  Administered Date(s) Administered   Influenza,inj,Quad PF,6+ Mos 03/21/2016, 08/01/2021, 01/24/2022   Influenza,inj,quad, With Preservative 02/02/2017   Influenza-Unspecified 03/05/2020   MMR 04/23/1986   Moderna Sars-Covid-2 Vaccination 06/12/2019, 08/22/2019, 03/30/2020   Polio, Unspecified 05/24/1978, 04/23/1986, 10/22/1986   Td (Adult),unspecified 05/24/1978, 04/23/1986, 07/23/1986   Tdap 05/05/2013, 06/08/2015     Objective: Vital Signs: BP 121/81 (BP Location: Left Arm, Patient Position: Sitting, Cuff Size: Normal)   Pulse 76   Resp 14   Ht 5\' 3"  (1.6 m)   Wt 165 lb (74.8 kg)   BMI 29.23 kg/m    Physical Exam Eyes:     Conjunctiva/sclera: Conjunctivae normal.  Cardiovascular:     Rate and Rhythm: Normal rate and regular rhythm.  Pulmonary:     Effort: Pulmonary effort is normal.     Breath sounds: Normal breath sounds.  Musculoskeletal:     Right lower leg: No edema.     Left lower leg: No edema.  Lymphadenopathy:     Cervical: No cervical adenopathy.  Skin:    General: Skin is warm and dry.     Comments: Mild skin peeling on tips halfway to DIPs on first 3 digits of both hands  Neurological:     Mental Status: She is alert.  Psychiatric:        Mood and Affect: Mood normal.      Musculoskeletal Exam:  Shoulders full ROM, left shoulder tenderness to pressure, no swelling Elbows full ROM no tenderness or swelling Wrists full ROM no tenderness or swelling Fingers full ROM no tenderness or swelling Mild cervical paraspinal muscle tenderness to pressure Worse tenderness at lumbar  spine midline and on left side, more so over left SI joint, less so over left lateral hip and glutes, right hip minimally tender Knees full ROM no tenderness or swelling Ankles full ROM no tenderness or swelling   Investigation: No additional findings.  Imaging: No results found.  Recent Labs: Lab Results  Component Value Date   WBC 8.4 07/19/2022   HGB 14.5 07/19/2022   PLT 331 07/19/2022   NA 140 07/19/2022   K 4.0 07/19/2022   CL 104  07/19/2022   CO2 28 07/19/2022   GLUCOSE 91 07/19/2022   BUN 11 07/19/2022   CREATININE 0.69 07/19/2022   BILITOT 0.7 07/19/2022   ALKPHOS 91 07/01/2020   AST 22 07/19/2022   ALT 26 07/19/2022   PROT 7.0 07/19/2022   ALBUMIN 4.6 07/01/2020   CALCIUM 9.7 07/19/2022   GFRAA 87 09/16/2020   QFTBGOLDPLUS NEGATIVE 10/12/2021    Speciality Comments: No specialty comments available.  Procedures:  No procedures performed Allergies: Lexapro [escitalopram], Lipitor [atorvastatin], Meloxicam, Phendimetrazine tartrate, Aspirin, and Nickel   Assessment / Plan:     Visit Diagnoses: Axial spondyloarthritis (HCC)  Currently reporting considerable low back pain and stiffness doing worse overall compared to when on Rinvoq treatment.  Symptoms manageable but active on the Advil.  Previously did not have sed rate or CRP elevation corresponding with symptom activity.  Would like to see further improvement still recommend continuing the Cosentyx 150 mg [place] monthly for a longer duration of treatment  High risk medication use - Plan: CBC with Differential/Platelet, COMPLETE METABOLIC PANEL WITH GFR, QuantiFERON-TB Gold Plus  Tolerating Cosentyx injections without particular reaction. Viral URI symptoms last week no serious infections since last visit. Checking CBC and CMP for medication monitoring. LFTs had improved since stopping her there medications. Checking TB screen to update from last year.  Pain in left hip  Left hip and posterior pain some appears  to localized at [facility] joint on exam today. Did not have imaging evidence of left sided inflammatory changes on previous imaging so not sure if this is more muscular pain or intraarticular. Discussed possible SI joint injection for diagnostic and therapeutic purposes.  Orders: Orders Placed This Encounter  Procedures   CBC with Differential/Platelet   COMPLETE METABOLIC PANEL WITH GFR   QuantiFERON-TB Gold Plus   No orders of the defined types were placed in this encounter.    Follow-Up Instructions: No follow-ups on file.   Fuller Plan, MD  Note - This record has been created using [employer].  Chart creation errors have been sought, but may not always  have been located. Such creation errors do not reflect on  the standard of medical care.

## 2022-11-17 LAB — COMPLETE METABOLIC PANEL WITH GFR
AG Ratio: 1.9 (calc) (ref 1.0–2.5)
ALT: 52 U/L — ABNORMAL HIGH (ref 6–29)
AST: 38 U/L — ABNORMAL HIGH (ref 10–35)
Albumin: 4.8 g/dL (ref 3.6–5.1)
Alkaline phosphatase (APISO): 96 U/L (ref 37–153)
BUN: 13 mg/dL (ref 7–25)
CO2: 28 mmol/L (ref 20–32)
Calcium: 9.9 mg/dL (ref 8.6–10.4)
Chloride: 101 mmol/L (ref 98–110)
Creat: 0.75 mg/dL (ref 0.50–1.03)
Globulin: 2.5 g/dL (calc) (ref 1.9–3.7)
Glucose, Bld: 94 mg/dL (ref 65–99)
Potassium: 4.3 mmol/L (ref 3.5–5.3)
Sodium: 140 mmol/L (ref 135–146)
Total Bilirubin: 0.8 mg/dL (ref 0.2–1.2)
Total Protein: 7.3 g/dL (ref 6.1–8.1)
eGFR: 96 mL/min/{1.73_m2} (ref >=60)

## 2022-11-17 LAB — CBC WITH DIFFERENTIAL/PLATELET
Absolute Monocytes: 563 cells/uL (ref 200–950)
Basophils Absolute: 44 cells/uL (ref 0–200)
Basophils Relative: 0.5 %
Eosinophils Absolute: 264 cells/uL (ref 15–500)
Eosinophils Relative: 3 %
HCT: 46.2 % — ABNORMAL HIGH (ref 35.0–45.0)
Hemoglobin: 15.3 g/dL (ref 11.7–15.5)
Lymphs Abs: 3608 cells/uL (ref 850–3900)
MCH: 28.8 pg (ref 27.0–33.0)
MCHC: 33.1 g/dL (ref 32.0–36.0)
MCV: 86.8 fL (ref 80.0–100.0)
MPV: 9.4 fL (ref 7.5–12.5)
Monocytes Relative: 6.4 %
Neutro Abs: 4321 cells/uL (ref 1500–7800)
Neutrophils Relative %: 49.1 %
Platelets: 361 10*3/uL (ref 140–400)
RBC: 5.32 10*6/uL — ABNORMAL HIGH (ref 3.80–5.10)
RDW: 13.5 % (ref 11.0–15.0)
Total Lymphocyte: 41 %
WBC: 8.8 10*3/uL (ref 3.8–10.8)

## 2022-11-17 LAB — QUANTIFERON-TB GOLD PLUS
Mitogen-NIL: 9.12 IU/mL
NIL: 0.11 IU/mL
QuantiFERON-TB Gold Plus: NEGATIVE
TB1-NIL: <0 IU/mL
TB2-NIL: <0 IU/mL

## 2022-11-27 ENCOUNTER — Encounter: Payer: Commercial Managed Care - PPO | Admitting: Medical-Surgical

## 2022-11-29 ENCOUNTER — Ambulatory Visit (INDEPENDENT_AMBULATORY_CARE_PROVIDER_SITE_OTHER): Payer: Commercial Managed Care - PPO | Admitting: Medical-Surgical

## 2022-11-29 ENCOUNTER — Encounter: Payer: Self-pay | Admitting: Medical-Surgical

## 2022-11-29 VITALS — BP 93/65 | HR 90 | Resp 20 | Ht 63.0 in | Wt 165.8 lb

## 2022-11-29 DIAGNOSIS — E782 Mixed hyperlipidemia: Secondary | ICD-10-CM

## 2022-11-29 DIAGNOSIS — F419 Anxiety disorder, unspecified: Secondary | ICD-10-CM

## 2022-11-29 DIAGNOSIS — Z13220 Encounter for screening for lipoid disorders: Secondary | ICD-10-CM | POA: Diagnosis not present

## 2022-11-29 DIAGNOSIS — E663 Overweight: Secondary | ICD-10-CM

## 2022-11-29 DIAGNOSIS — G479 Sleep disorder, unspecified: Secondary | ICD-10-CM

## 2022-11-29 DIAGNOSIS — Z0000 Encounter for general adult medical examination without abnormal findings: Secondary | ICD-10-CM

## 2022-11-29 DIAGNOSIS — K219 Gastro-esophageal reflux disease without esophagitis: Secondary | ICD-10-CM

## 2022-11-29 DIAGNOSIS — Z1329 Encounter for screening for other suspected endocrine disorder: Secondary | ICD-10-CM

## 2022-11-29 DIAGNOSIS — J4520 Mild intermittent asthma, uncomplicated: Secondary | ICD-10-CM

## 2022-11-29 MED ORDER — DULOXETINE HCL 30 MG PO CPEP: 30.0000 mg | ORAL_CAPSULE | Freq: Every day | ORAL | 1 refills | Status: DC

## 2022-11-29 MED ORDER — ALBUTEROL SULFATE HFA 108 (90 BASE) MCG/ACT IN AERS: 2.0000 | INHALATION_SPRAY | Freq: Four times a day (QID) | RESPIRATORY_TRACT | 11 refills | Status: DC | PRN

## 2022-11-29 MED ORDER — SEMAGLUTIDE (2 MG/DOSE) 8 MG/3ML ~~LOC~~ SOPN: 2.0000 mg | PEN_INJECTOR | SUBCUTANEOUS | 1 refills | Status: DC

## 2022-11-29 MED ORDER — DULOXETINE HCL 60 MG PO CPEP: 60.0000 mg | ORAL_CAPSULE | Freq: Every day | ORAL | 1 refills | Status: DC

## 2022-11-29 MED ORDER — GABAPENTIN 300 MG PO CAPS: 300.0000 mg | ORAL_CAPSULE | Freq: Every day | ORAL | 3 refills | Status: DC

## 2022-11-29 NOTE — Progress Notes (Signed)
Complete physical exam  Patient: Breanna [name]   DOB: August 20, 1970   52 y.o. Female  MRN: [id number]  Subjective:    Chief Complaint  Patient presents with   Annual Exam   Breanna [name] is a 52 y.o. female who presents today for a complete physical exam. She reports consuming a general diet. Exercise is limited by orthopedic condition(s):  Marland Kitchen She generally feels fairly well. She reports sleeping poorly. She does not have additional problems to discuss today.    Most recent fall risk assessment:    11/29/2022   10:33 AM  Fall Risk   Falls in the past year? 0  Number falls in past yr: 0  Injury with Fall? 0  Risk for fall due to : No Fall Risks  Follow up Falls evaluation completed     Most recent depression screenings:    11/29/2022   10:33 AM 08/08/2022    1:52 PM  PHQ 2/9 Scores  PHQ - 2 Score 1 0    Vision:Within last year, Dental: No current dental problems and Receives regular dental care, and STD: The patient denies history of sexually transmitted disease.    Patient Care Team: Christen Butter, NP as PCP - General (Nurse Practitioner)   Outpatient Medications Prior to Visit  Medication Sig   Ibuprofen-Acetaminophen 125-250 MG TABS as needed.   ondansetron (ZOFRAN-ODT) 8 MG disintegrating tablet Take 1 tablet (8 mg total) by mouth every 8 (eight) hours as needed for nausea.   rosuvastatin (CRESTOR) 40 MG tablet Take 1 tablet (40 mg total) by mouth daily.   Secukinumab (COSENTYX SENSOREADY PEN) 150 MG/ML SOAJ Inject 150mg  into the skin at Week 4 then every 4 weeks thereafter   VITAMIN D, CHOLECALCIFEROL, PO Take by mouth.   [DISCONTINUED] albuterol (VENTOLIN HFA) 108 (90 Base) MCG/ACT inhaler Inhale 2 puffs into the lungs every 6 (six) hours as needed for wheezing.   [DISCONTINUED] DULoxetine (CYMBALTA) 60 MG capsule Take 1 capsule (60 mg total) by mouth daily.   [DISCONTINUED] Semaglutide, 2 MG/DOSE, 8 MG/3ML SOPN Inject 2 mg as directed once a week.   [DISCONTINUED]  Secukinumab (COSENTYX SENSOREADY PEN) 150 MG/ML SOAJ Inject 150mg  into the skin at Week 1, 2, 3. [Week 0 dose received in clinic in 08/23/2022] (Patient not taking: Reported on 11/29/2022)   No facility-administered medications prior to visit.    Review of Systems  Constitutional:  Positive for malaise/fatigue. Negative for chills, fever and weight loss.  HENT:  Negative for congestion, ear pain, hearing loss, sinus pain and sore throat.   Eyes:  Negative for blurred vision, photophobia and pain.  Respiratory:  Positive for wheezing (Intermittent during asthma flares). Negative for cough and shortness of breath.   Cardiovascular:  Negative for chest pain, palpitations and leg swelling.  Gastrointestinal:  Negative for abdominal pain, constipation, diarrhea, heartburn, nausea and vomiting.  Genitourinary:  Negative for dysuria, frequency and urgency.       Stress incontinence  Musculoskeletal:  Positive for back pain, joint pain, myalgias and neck pain. Negative for falls.  Skin:  Negative for itching and rash.  Neurological:  Negative for dizziness, weakness and headaches.  Endo/Heme/Allergies:  Negative for polydipsia. Does not bruise/bleed easily.  Psychiatric/Behavioral:  Positive for depression. Negative for substance abuse and suicidal ideas. The patient is nervous/anxious and has insomnia.      Objective:    BP 93/65 (BP Location: Right Arm, Cuff Size: Normal)   Pulse 90   Resp 20   Ht  5\' 3"  (1.6 m)   Wt 165 lb 12.8 oz (75.2 kg)   SpO2 98%   BMI 29.37 kg/m    Physical Exam Vitals reviewed.  Constitutional:      General: She is not in acute distress.    Appearance: Normal appearance. She is not ill-appearing.  HENT:     Head: Normocephalic and atraumatic.     Right Ear: Tympanic membrane, ear canal and external ear normal. There is no impacted cerumen.     Left Ear: Tympanic membrane, ear canal and external ear normal. There is no impacted cerumen.     Nose: Nose normal.  No congestion or rhinorrhea.     Mouth/Throat:     Mouth: Mucous membranes are moist.     Pharynx: No oropharyngeal exudate or posterior oropharyngeal erythema.  Eyes:     General: No scleral icterus.       Right eye: No discharge.        Left eye: No discharge.     Extraocular Movements: Extraocular movements intact.     Conjunctiva/sclera: Conjunctivae normal.     Pupils: Pupils are equal, round, and reactive to light.  Neck:     Thyroid: No thyromegaly.     Vascular: No carotid bruit or JVD.     Trachea: Trachea normal.  Cardiovascular:     Rate and Rhythm: Normal rate and regular rhythm.     Pulses: Normal pulses.     Heart sounds: Normal heart sounds. No murmur heard.    No friction rub. No gallop.  Pulmonary:     Effort: Pulmonary effort is normal. No respiratory distress.     Breath sounds: Normal breath sounds. No wheezing.  Abdominal:     General: Bowel sounds are normal. There is no distension.     Palpations: Abdomen is soft.     Tenderness: There is no abdominal tenderness. There is no guarding.  Musculoskeletal:        General: Normal range of motion.     Cervical back: Normal range of motion and neck supple.  Lymphadenopathy:     Cervical: No cervical adenopathy.  Skin:    General: Skin is warm and dry.  Neurological:     Mental Status: She is alert and oriented to person, place, and time.     Cranial Nerves: No cranial nerve deficit.  Psychiatric:        Mood and Affect: Mood normal.        Behavior: Behavior normal.        Thought Content: Thought content normal.        Judgment: Judgment normal.   No results found for any visits on 11/29/22.     Assessment & Plan:    Routine Health Maintenance and Physical Exam  Immunization History  Administered Date(s) Administered   Influenza,inj,Quad PF,6+ Mos 03/21/2016, 08/01/2021, 01/24/2022   Influenza,inj,quad, With Preservative 02/02/2017   Influenza-Unspecified 03/05/2020   MMR 04/23/1986   Moderna  Sars-Covid-2 Vaccination 06/12/2019, 08/22/2019, 03/30/2020   Polio, Unspecified 05/24/1978, 04/23/1986, 10/22/1986   Td (Adult),unspecified 05/24/1978, 04/23/1986, 07/23/1986   Tdap 05/05/2013, 06/08/2015    Health Maintenance  Topic Date Due   Zoster Vaccines- Shingrix (1 of 2) Never done   COVID-19 Vaccine (4 - 2023-24 season) 12/15/2022 (Originally 02/03/2022)   INFLUENZA VACCINE  01/04/2023   MAMMOGRAM  10/14/2023   DTaP/Tdap/Td (5 - Td or Tdap) 06/07/2025   Colonoscopy  11/11/2031   Hepatitis C Screening  Completed   HIV Screening  Completed   HPV VACCINES  Aged Out    Discussed health benefits of physical activity, and encouraged her to engage in regular exercise appropriate for her age and condition.  1. Annual physical exam Reviewed recent CBC and CMP.  Up-to-date on most preventative care however would benefit from updating her eye exam.  Wellness information provided with AVS.  2.  Mixed hyperlipidemia Checking lipid panel today.  Continue Crestor 40 mg daily. - Lipid panel  3. Anxiety Has been treated with Cymbalta 60 mg daily which was working fairly well for her however there has been some significant increase in stress at home/work and she feels that she is struggling.  Reviewed recommendations for management of symptoms.  She would like to try a slightly higher dose of Cymbalta so plan to continue Cymbalta 60 mg in the morning with a 30 mg dose at night. - DULoxetine (CYMBALTA) 60 MG capsule; Take 1 capsule (60 mg total) by mouth daily.  Dispense: 90 capsule; Refill: 1  5. Thyroid disorder screen Checking TSH. - TSH  6. Overweight (BMI 25.0-29.9) Previous morbid obesity has greatly improved with the use of Ozempic 2 mg weekly.  She is now dropped her BMI to 29.37 however still has room for improvement.  Ability to exercise is greatly limited.  With her history of prediabetes as well as multiple musculoskeletal concerns that are drastically affected by weight, feel  that she would benefit from continuing this medication.  Refilling semaglutide.  7. Mild intermittent asthma without complication Refilling albuterol for as needed use.  8. Gastroesophageal reflux disease without esophagitis Currently well-managed.  9.  Trouble in sleeping Long history of difficulty with sleep and has not been resting well overall.  The recent increase in anxiety/stress has also negatively impacted her sleep quality.  Adding Cymbalta as above however feel that she needs something to help her specifically with sleeping.  Reviewed various options and with her musculoskeletal pains, feel that gabapentin 300 mg nightly would be a good fit.  Patient is amenable so sending to pharmacy for her to give a try.  Return in about 6 weeks (around 01/10/2023) for mood follow up (ok to be virtual).   Christen Butter, NP

## 2022-11-30 LAB — LIPID PANEL
Cholesterol: 137 mg/dL (ref <200)
HDL: 58 mg/dL (ref >=50)
LDL Cholesterol (Calc): 54 mg/dL (calc)
Non-HDL Cholesterol (Calc): 79 mg/dL (calc) (ref <130)
Total CHOL/HDL Ratio: 2.4 (calc) (ref <5.0)
Triglycerides: 186 mg/dL — ABNORMAL HIGH (ref <150)

## 2022-11-30 LAB — TSH: TSH: 1.63 mIU/L

## 2022-12-16 ENCOUNTER — Encounter: Payer: Self-pay | Admitting: Medical-Surgical

## 2022-12-18 ENCOUNTER — Other Ambulatory Visit: Payer: Self-pay | Admitting: Internal Medicine

## 2022-12-18 DIAGNOSIS — M4690 Unspecified inflammatory spondylopathy, site unspecified: Secondary | ICD-10-CM

## 2022-12-18 DIAGNOSIS — Z79899 Other long term (current) drug therapy: Secondary | ICD-10-CM

## 2022-12-18 MED ORDER — ALBUTEROL SULFATE HFA 108 (90 BASE) MCG/ACT IN AERS: 2.0000 | INHALATION_SPRAY | Freq: Four times a day (QID) | RESPIRATORY_TRACT | 11 refills | Status: DC | PRN

## 2022-12-18 NOTE — Telephone Encounter (Signed)
Can you please check on the patient's refill based on last two office notes and your loading dose based on authorization? They are not the same. Thank you.

## 2022-12-18 NOTE — Telephone Encounter (Signed)
Cosentyx dosing: 150 mg subcut at weeks 0 (administered in clinic today), weeks 1, 2, 3, and 4 followed by 150 mg every 4 weeks.    Rx sent to [facility] 150mg  every 4 weeks  Chesley Mires, PharmD, MPH, BCPS, CPP Clinical Pharmacist (Rheumatology and Pulmonology)

## 2023-01-10 ENCOUNTER — Telehealth (INDEPENDENT_AMBULATORY_CARE_PROVIDER_SITE_OTHER): Payer: Commercial Managed Care - PPO | Admitting: Medical-Surgical

## 2023-01-10 ENCOUNTER — Encounter: Payer: Self-pay | Admitting: Medical-Surgical

## 2023-01-10 DIAGNOSIS — R0989 Other specified symptoms and signs involving the circulatory and respiratory systems: Secondary | ICD-10-CM | POA: Diagnosis not present

## 2023-01-10 DIAGNOSIS — F419 Anxiety disorder, unspecified: Secondary | ICD-10-CM

## 2023-01-10 DIAGNOSIS — M79644 Pain in right finger(s): Secondary | ICD-10-CM

## 2023-01-10 MED ORDER — ONDANSETRON 8 MG PO TBDP: 8.0000 mg | ORAL_TABLET | Freq: Three times a day (TID) | ORAL | 3 refills | Status: DC | PRN

## 2023-01-10 NOTE — Progress Notes (Signed)
Virtual Visit via Video Note  I connected with Norell Henningson on 01/10/23 at 11:10 AM EDT by a video enabled telemedicine application and verified that I am speaking with the correct person using two identifiers.   I discussed the limitations of evaluation and management by telemedicine and the availability of in person appointments. The patient expressed understanding and agreed to proceed.  Patient location: home Provider locations: office  Subjective:    CC: Mood follow-up  HPI: Pleasant 52 year old female presenting via MyChart video visit for follow-up on mood.  Approximately 6 weeks ago, we increased her Cymbalta to a total of 90 mg daily.  She has been taking 60 mg nightly and 30 mg in the morning.  Tolerating the increased dose well without side effects.  Feels that her mood symptoms may have improved a little.  A lot of her stressors are situational and can be quite variable.  Denies SI/HI.  Notes a scratchy throat with sinus congestion over the last 2 or 3 days.  Notes that her seasonal allergies have been bad lately.  She is taking a daily antihistamine and using Flonase.  Has not done any testing for COVID.  Has been having some pain in her right ring finger with flexion.  Pain affects the DIP and PIP joint.  No recent injury however she has had a cyst removed in that area before.  Pain has been worse over the last 2-3 weeks and is now waking her at night due to discomfort.   Past medical history, Surgical history, Family history not pertinant except as noted below, Social history, Allergies, and medications have been entered into the medical record, reviewed, and corrections made.   Review of Systems: See HPI for pertinent positives and negatives.   Objective:    General: Speaking clearly in complete sentences without any shortness of breath.  Alert and oriented x3.  Normal judgment. No apparent acute distress.  Impression and Recommendations:    1. Anxiety Discussed  options for management.  Considered further adjustment of Cymbalta dosing to 60 mg twice daily however she would like to stay at her current dose and give it a little longer.  Plan to touch base in 3 months to reevaluate.  2. Pain of finger of right hand Unclear etiology.  Getting x-rays for further evaluation.  Discussed conservative measures including anti-inflammatories, buddy taping, heat/ice, etc. - DG Finger Ring Right; Future  3. Respiratory symptoms Recommend COVID testing since we are seeing an increase of this in the community.  Likely viral etiology at this point.  If symptoms fail to improve within 7 to 10 days, advised her to reach out to me for further recommendations.  I discussed the assessment and treatment plan with the patient. The patient was provided an opportunity to ask questions and all were answered. The patient agreed with the plan and demonstrated an understanding of the instructions.   The patient was advised to call back or seek an in-person evaluation if the symptoms worsen or if the condition fails to improve as anticipated.  25 minutes of non-face-to-face time was provided during this encounter.  Return in about 3 months (around 04/12/2023) for mood follow up.  Thayer Ohm, DNP, APRN, FNP-BC [facility]

## 2023-01-15 ENCOUNTER — Ambulatory Visit (INDEPENDENT_AMBULATORY_CARE_PROVIDER_SITE_OTHER): Payer: Commercial Managed Care - PPO

## 2023-01-15 DIAGNOSIS — M79644 Pain in right finger(s): Secondary | ICD-10-CM | POA: Diagnosis not present

## 2023-01-19 ENCOUNTER — Encounter: Payer: Self-pay | Admitting: Medical-Surgical

## 2023-01-19 MED ORDER — FLUCONAZOLE 150 MG PO TABS: 150.0000 mg | ORAL_TABLET | Freq: Once | ORAL | 0 refills | Status: AC

## 2023-01-19 MED ORDER — AMOXICILLIN-POT CLAVULANATE 875-125 MG PO TABS: 1.0000 | ORAL_TABLET | Freq: Two times a day (BID) | ORAL | 0 refills | Status: DC

## 2023-02-08 NOTE — Progress Notes (Deleted)
Office Visit Note  Patient: Breanna [name]             Date of Birth: 27-Mar-1971           MRN: [id number]             PCP: Christen Butter, NP Referring: Christen Butter, NP Visit Date: 02/19/2023   Subjective:  No chief complaint on file.   History of Present Illness: Breanna [name] is a 52 y.o. 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    Previous HPI 11/15/2022 Breanna [name] is a 52 y.o. 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  Since starting medicine she has not seen a significant improvement in joint symptoms.  Has low back pain does wake her up at night sometimes multiple times and with some prolonged morning stiffness or stiffness after sitting.  Still has pain in her neck and shoulders but the low back pain is now worse than these.  Gets some benefit with Advil taking about 2 daily. She completed left shoulder PT and is doing well still has some soreness.   Previous HPI 07/19/22 Breanna [name] is a 52 y.o. 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 Symptoms are noticeably worse off treatment with back pain and stiffness most prominent in the morning and with prolonged sitting. Lasts at least about 30 minutes into the morning. Right side is worse than left, describes some episodic worsening of symptoms that start feeling like a cramp in her gluteal muscle area. Left shoulder surgery went well currently in physical therapy ongoing for this.   Previous HPI 02/20/22  Breanna [name] is a 52 y.o. 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  Her symptoms are not terribly worse but definitely noticed a increase in the pain and stiffness again off rinvoq.    Previous HPI 11/25/2021 Breanna [name] is a 53 y.o. 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 She feels symptoms are partially improving since starting the medication. Less stiffness and pain in the low back especially during the night and after stationary positions. She still sees some swelling in her fingers on both hands. Shoulder and knee pains are about the same. She noticed some increase in facial acne otherwise no noticeable side effects.   Previous HPI 10/12/21 Breanna [name] is a 52 y.o. female here for follow up for axial spondyloarthritis on Enbrel 50 mg [place] weekly. She has not seen much additional improvement staying on the Enbrel. Back stiffness is worst every morning. More recently having some left shoulder and right elbow pain also but these are only stiff for less than 30 minutes in mornings. She has increased in skin peeling on fingertips of both hands she thinks this is related to washing and soap and has had similar problems before.   Previous HPI 07/14/2021 Breanna [name] is a 52 y.o. female here for follow up axial spondyloarthritis on Enbrel 50 mg [place] weekly. At last visit she had not yet seen a major change in symptoms with this treatment.  She feels symptoms are partially but noticeably improved since starting the Enbrel.  She describes nighttime awakenings from about 4 times  to 3 times per night.  Her morning stiffness is decreased to about 10 minutes duration.  Low back pain is overall decreased in severity but especially the pain after waking and stationary positions.  She had a recent neck epidural treatment that helped a lot with neck pain though she still notices some stiffness to range of motion and crepitus.   Previous HPI 04/14/21 Breanna [name] is a 52 y.o. 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 She has not noticed any large  change in symptoms after starting this medication. Still has morning stiffness and low back pain, some hand pain, and some night time awakenings. She reports increased left shoulder pain especially with raising her arm overhead more since the past 2-3 weeks. She had joint injection for this in July with a good improvement until now. She also tried increasing the lyrica medication for joint pain and some cervical spine degenerative changes with possible compression, but stopped this due to feeling poorly on this and worse at higher dose.   Previous HPI 12/28/20 Breanna [name] is a 52 y.o. female here for axial and peripheral spondyloarthritis. She previously saw a rheumatologist in [place] and took Humira for this but has been off treatment for at least a year. Review of previous rheumatology records indicates most recent treatment was on methotrexate and humira with reasonably good disease control. Apparently GI intolerance of methotrexate with titration to 25 mg PO weekly but tolerated at lower doses and labs monitoring were okay. She previously had joint involvement with pain in the lower thoracic spine, lumbar spine, right SI joint, and bilateral heels. She has previously reported joint pain dating back at least 20 years in duration. She moved from [place] due to work so has been off treatment since that time. She has noticed some increase in joint pain and stiffness but worst symptom increase is fatigue. She felt Humira was controlled symptoms well initially with loss of improvement, and addition of methotrexate did not improve much. She currently has neck and back pain mostly, at night and in the morning but also increase in neck pain during the day working. She recently underwent left shoulder injection  a month ago with improvement in pain there. Otherwise she has not taken any steroid medication recently.   Outside labs reviewed HLA-B27 pos   DMARD Hx Enbrel 2022-2023 Humira 2020-? MTX  2020-? NSAIDs - inadequate response   No Rheumatology ROS completed.   PMFS History:  Patient Active Problem List   Diagnosis Date Noted   Overweight (BMI 25.0-29.9) 11/29/2022   Pain in left hip 11/15/2022   Liver function test abnormality 02/20/2022   Lateral epicondylitis, right elbow 12/13/2021   Gastroesophageal reflux disease without esophagitis 04/14/2021   Menopause 04/14/2021   Pure hypercholesterolemia 04/14/2021   Stress incontinence (female)  04/14/2021   High risk medication use 12/28/2020   Primary osteoarthritis of right knee 12/21/2020   Morton's neuroma, right 12/21/2020   Chronic left shoulder pain 11/23/2020   DDD (degenerative disc disease), cervical 11/23/2020   Hyperlipidemia    Asthma    Axial spondyloarthritis (HCC)    Anxiety     Past Medical History:  Diagnosis Date   Ankylosing spondylitis (HCC)    Anxiety    Arthritis    Asthma    GERD (gastroesophageal reflux disease)    Hiatal hernia    Hyperlipidemia    Menopause    Shingles     Family History  Problem Relation  Age of Onset   Hypertension Mother    Heart attack Father    Healthy Brother    HIV Son    Lupus Cousin    Rheum arthritis Cousin    Past Surgical History:  Procedure Laterality Date   BREAST SURGERY     CARPAL TUNNEL RELEASE     epidural steroid injection  07/05/2021   in neck   GALLBLADDER SURGERY     KNEE SURGERY     ROTATOR CUFF REPAIR Left 05/08/2022   bicep tendon tear repair   TOTAL ABDOMINAL HYSTERECTOMY     Social History   Social History Narrative   Not on file   Immunization History  Administered Date(s) Administered   Influenza,inj,Quad PF,6+ Mos 03/21/2016, 08/01/2021, 01/24/2022   Influenza,inj,quad, With Preservative 02/02/2017   Influenza-Unspecified 03/05/2020   MMR 04/23/1986   Moderna Sars-Covid-2 Vaccination 06/12/2019, 08/22/2019, 03/30/2020   Polio, Unspecified 05/24/1978, 04/23/1986, 10/22/1986   Td (Adult),unspecified 05/24/1978,  04/23/1986, 07/23/1986   Tdap 05/05/2013, 06/08/2015     Objective: Vital Signs: There were no vitals taken for this visit.   Physical Exam   Musculoskeletal Exam: ***  CDAI Exam: CDAI Score: -- Patient Global: --; Provider Global: -- Swollen: --; Tender: -- Joint Exam 02/19/2023   No joint exam has been documented for this visit   There is currently no information documented on the homunculus. Go to the Rheumatology activity and complete the homunculus joint exam.  Investigation: No additional findings.  Imaging: DG Finger Ring Right  Result Date: 01/21/2023 CLINICAL DATA:  pain in the right ring finger x 2-3 weeks EXAM: RIGHT RING FINGER 2+V COMPARISON:  None Available. FINDINGS: No acute fracture or dislocation. Joint spaces and alignment are maintained. No area of erosion or osseous destruction. No unexpected radiopaque foreign body. Soft tissues are unremarkable. IMPRESSION: 1. No acute fracture or dislocation Electronically Signed   By: Meda Klinefelter M.D.   On: 01/21/2023 12:13    Recent Labs: Lab Results  Component Value Date   WBC 8.8 11/15/2022   HGB 15.3 11/15/2022   PLT 361 11/15/2022   NA 140 11/15/2022   K 4.3 11/15/2022   CL 101 11/15/2022   CO2 28 11/15/2022   GLUCOSE 94 11/15/2022   BUN 13 11/15/2022   CREATININE 0.75 11/15/2022   BILITOT 0.8 11/15/2022   ALKPHOS 91 07/01/2020   AST 38 (H) 11/15/2022   ALT 52 (H) 11/15/2022   PROT 7.3 11/15/2022   ALBUMIN 4.6 07/01/2020   CALCIUM 9.9 11/15/2022   GFRAA 87 09/16/2020   QFTBGOLDPLUS NEGATIVE 11/15/2022    Speciality Comments: No specialty comments available.  Procedures:  No procedures performed Allergies: Lexapro [escitalopram], Lipitor [atorvastatin], Meloxicam, Phendimetrazine tartrate, Aspirin, and Nickel   Assessment / Plan:     Visit Diagnoses: No diagnosis found.  ***  Orders: No orders of the defined types were placed in this encounter.  No orders of the defined types were  placed in this encounter.    Follow-Up Instructions: No follow-ups on file.   Metta Clines, RT  Note - This record has been created using [employer].  Chart creation errors have been sought, but may not always  have been located. Such creation errors do not reflect on  the standard of medical care.

## 2023-02-19 ENCOUNTER — Ambulatory Visit: Payer: Commercial Managed Care - PPO | Admitting: Internal Medicine

## 2023-02-19 DIAGNOSIS — Z79899 Other long term (current) drug therapy: Secondary | ICD-10-CM

## 2023-02-19 DIAGNOSIS — M4690 Unspecified inflammatory spondylopathy, site unspecified: Secondary | ICD-10-CM

## 2023-02-19 DIAGNOSIS — M25552 Pain in left hip: Secondary | ICD-10-CM

## 2023-02-19 NOTE — Progress Notes (Deleted)
Office Visit Note  Patient: Breanna [name]             Date of Birth: Jan 19, 1971           MRN: [id number]             PCP: Christen Butter, NP Referring: Christen Butter, NP Visit Date: 03/05/2023   Subjective:  No chief complaint on file.   History of Present Illness: Breanna [name] is a 52 y.o. 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    Previous HPI 11/15/2022 Breanna [name] is a 52 y.o. 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  Since starting medicine she has not seen a significant improvement in joint symptoms.  Has low back pain does wake her up at night sometimes multiple times and with some prolonged morning stiffness or stiffness after sitting.  Still has pain in her neck and shoulders but the low back pain is now worse than these.  Gets some benefit with Advil taking about 2 daily. She completed left shoulder PT and is doing well still has some soreness.   Previous HPI 07/19/22 Breanna [name] is a 52 y.o. 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 Symptoms are noticeably worse off treatment with back pain and stiffness most prominent in the morning and with prolonged sitting. Lasts at least about 30 minutes into the morning. Right side is worse than left, describes some episodic worsening of symptoms that start feeling like a cramp in her gluteal muscle area. Left shoulder surgery went well currently in physical therapy ongoing for this.   Previous HPI 02/20/22  Breanna [name] is a 52 y.o. 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  Her symptoms are not terribly worse but definitely noticed a increase in the pain and stiffness again off rinvoq.    Previous HPI 11/25/2021 Breanna [name] is a 52 y.o. 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 She feels symptoms are partially improving since starting the medication. Less stiffness and pain in the low back especially during the night and after stationary positions. She still sees some swelling in her fingers on both hands. Shoulder and knee pains are about the same. She noticed some increase in facial acne otherwise no noticeable side effects.   Previous HPI 10/12/21 Breanna [name] is a 52 y.o. female here for follow up for axial spondyloarthritis on Enbrel 50 mg [place] weekly. She has not seen much additional improvement staying on the Enbrel. Back stiffness is worst every morning. More recently having some left shoulder and right elbow pain also but these are only stiff for less than 30 minutes in mornings. She has increased in skin peeling on fingertips of both hands she thinks this is related to washing and soap and has had similar problems before.   Previous HPI 07/14/2021 Breanna [name] is a 52 y.o. female here for follow up axial spondyloarthritis on Enbrel 50 mg [place] weekly. At last visit she had not yet seen a major change in symptoms with this treatment.  She feels symptoms are partially but noticeably improved since starting the Enbrel.  She describes nighttime awakenings from about 4 times  to 3 times per night.  Her morning stiffness is decreased to about 10 minutes duration.  Low back pain is overall decreased in severity but especially the pain after waking and stationary positions.  She had a recent neck epidural treatment that helped a lot with neck pain though she still notices some stiffness to range of motion and crepitus.   Previous HPI 04/14/21 Breanna [name] is a 52 y.o. 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 She has not noticed any large  change in symptoms after starting this medication. Still has morning stiffness and low back pain, some hand pain, and some night time awakenings. She reports increased left shoulder pain especially with raising her arm overhead more since the past 2-3 weeks. She had joint injection for this in July with a good improvement until now. She also tried increasing the lyrica medication for joint pain and some cervical spine degenerative changes with possible compression, but stopped this due to feeling poorly on this and worse at higher dose.   Previous HPI 12/28/20 Breanna [name] is a 52 y.o. female here for axial and peripheral spondyloarthritis. She previously saw a rheumatologist in [place] and took Humira for this but has been off treatment for at least a year. Review of previous rheumatology records indicates most recent treatment was on methotrexate and humira with reasonably good disease control. Apparently GI intolerance of methotrexate with titration to 25 mg PO weekly but tolerated at lower doses and labs monitoring were okay. She previously had joint involvement with pain in the lower thoracic spine, lumbar spine, right SI joint, and bilateral heels. She has previously reported joint pain dating back at least 20 years in duration. She moved from [place] due to work so has been off treatment since that time. She has noticed some increase in joint pain and stiffness but worst symptom increase is fatigue. She felt Humira was controlled symptoms well initially with loss of improvement, and addition of methotrexate did not improve much. She currently has neck and back pain mostly, at night and in the morning but also increase in neck pain during the day working. She recently underwent left shoulder injection  a month ago with improvement in pain there. Otherwise she has not taken any steroid medication recently.   Outside labs reviewed HLA-B27 pos   DMARD Hx Enbrel 2022-2023 Humira 2020-? MTX  2020-? NSAIDs - inadequate response   No Rheumatology ROS completed.   PMFS History:  Patient Active Problem List   Diagnosis Date Noted   Overweight (BMI 25.0-29.9) 11/29/2022   Pain in left hip 11/15/2022   Liver function test abnormality 02/20/2022   Lateral epicondylitis, right elbow 12/13/2021   Gastroesophageal reflux disease without esophagitis 04/14/2021   Menopause 04/14/2021   Pure hypercholesterolemia 04/14/2021   Stress incontinence (female)  04/14/2021   High risk medication use 12/28/2020   Primary osteoarthritis of right knee 12/21/2020   Morton's neuroma, right 12/21/2020   Chronic left shoulder pain 11/23/2020   DDD (degenerative disc disease), cervical 11/23/2020   Hyperlipidemia    Asthma    Axial spondyloarthritis (HCC)    Anxiety     Past Medical History:  Diagnosis Date   Ankylosing spondylitis (HCC)    Anxiety    Arthritis    Asthma    GERD (gastroesophageal reflux disease)    Hiatal hernia    Hyperlipidemia    Menopause    Shingles     Family History  Problem Relation  Age of Onset   Hypertension Mother    Heart attack Father    Healthy Brother    HIV Son    Lupus Cousin    Rheum arthritis Cousin    Past Surgical History:  Procedure Laterality Date   BREAST SURGERY     CARPAL TUNNEL RELEASE     epidural steroid injection  07/05/2021   in neck   GALLBLADDER SURGERY     KNEE SURGERY     ROTATOR CUFF REPAIR Left 05/08/2022   bicep tendon tear repair   TOTAL ABDOMINAL HYSTERECTOMY     Social History   Social History Narrative   Not on file   Immunization History  Administered Date(s) Administered   Influenza,inj,Quad PF,6+ Mos 03/21/2016, 08/01/2021, 01/24/2022   Influenza,inj,quad, With Preservative 02/02/2017   Influenza-Unspecified 03/05/2020   MMR 04/23/1986   Moderna Sars-Covid-2 Vaccination 06/12/2019, 08/22/2019, 03/30/2020   Polio, Unspecified 05/24/1978, 04/23/1986, 10/22/1986   Td (Adult),unspecified 05/24/1978,  04/23/1986, 07/23/1986   Tdap 05/05/2013, 06/08/2015     Objective: Vital Signs: There were no vitals taken for this visit.   Physical Exam   Musculoskeletal Exam: ***  CDAI Exam: CDAI Score: -- Patient Global: --; Provider Global: -- Swollen: --; Tender: -- Joint Exam 03/05/2023   No joint exam has been documented for this visit   There is currently no information documented on the homunculus. Go to the Rheumatology activity and complete the homunculus joint exam.  Investigation: No additional findings.  Imaging: No results found.  Recent Labs: Lab Results  Component Value Date   WBC 8.8 11/15/2022   HGB 15.3 11/15/2022   PLT 361 11/15/2022   NA 140 11/15/2022   K 4.3 11/15/2022   CL 101 11/15/2022   CO2 28 11/15/2022   GLUCOSE 94 11/15/2022   BUN 13 11/15/2022   CREATININE 0.75 11/15/2022   BILITOT 0.8 11/15/2022   ALKPHOS 91 07/01/2020   AST 38 (H) 11/15/2022   ALT 52 (H) 11/15/2022   PROT 7.3 11/15/2022   ALBUMIN 4.6 07/01/2020   CALCIUM 9.9 11/15/2022   GFRAA 87 09/16/2020   QFTBGOLDPLUS NEGATIVE 11/15/2022    Speciality Comments: No specialty comments available.  Procedures:  No procedures performed Allergies: Lexapro [escitalopram], Lipitor [atorvastatin], Meloxicam, Phendimetrazine tartrate, Aspirin, and Nickel   Assessment / Plan:     Visit Diagnoses: No diagnosis found.  ***  Orders: No orders of the defined types were placed in this encounter.  No orders of the defined types were placed in this encounter.    Follow-Up Instructions: No follow-ups on file.   Metta Clines, RT  Note - This record has been created using [employer].  Chart creation errors have been sought, but may not always  have been located. Such creation errors do not reflect on  the standard of medical care.

## 2023-03-05 ENCOUNTER — Ambulatory Visit: Payer: Commercial Managed Care - PPO | Admitting: Internal Medicine

## 2023-03-05 DIAGNOSIS — Z79899 Other long term (current) drug therapy: Secondary | ICD-10-CM

## 2023-03-05 DIAGNOSIS — M25552 Pain in left hip: Secondary | ICD-10-CM

## 2023-03-05 DIAGNOSIS — M4690 Unspecified inflammatory spondylopathy, site unspecified: Secondary | ICD-10-CM

## 2023-03-22 NOTE — Progress Notes (Deleted)
Office Visit Note  Patient: Breanna [name]             Date of Birth: 1971-01-28           MRN: [id number]             PCP: Christen Butter, NP Referring: Christen Butter, NP Visit Date: 04/04/2023   Subjective:  No chief complaint on file.   History of Present Illness: Breanna [name] is a 52 y.o. 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    Previous HPI 11/15/2022 Breanna [name] is a 52 y.o. 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  Since starting medicine she has not seen a significant improvement in joint symptoms.  Has low back pain does wake her up at night sometimes multiple times and with some prolonged morning stiffness or stiffness after sitting.  Still has pain in her neck and shoulders but the low back pain is now worse than these.  Gets some benefit with Advil taking about 2 daily. She completed left shoulder PT and is doing well still has some soreness.   Previous HPI 07/19/22 Breanna [name] is a 52 y.o. 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 Symptoms are noticeably worse off treatment with back pain and stiffness most prominent in the morning and with prolonged sitting. Lasts at least about 30 minutes into the morning. Right side is worse than left, describes some episodic worsening of symptoms that start feeling like a cramp in her gluteal muscle area. Left shoulder surgery went well currently in physical therapy ongoing for this.   Previous HPI 02/20/22  Breanna [name] is a 52 y.o. 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  Her symptoms are not terribly worse but definitely noticed a increase in the pain and stiffness again off rinvoq.    Previous HPI 11/25/2021 Breanna [name] is a 52 y.o. 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 She feels symptoms are partially improving since starting the medication. Less stiffness and pain in the low back especially during the night and after stationary positions. She still sees some swelling in her fingers on both hands. Shoulder and knee pains are about the same. She noticed some increase in facial acne otherwise no noticeable side effects.   Previous HPI 10/12/21 Breanna [name] is a 52 y.o. female here for follow up for axial spondyloarthritis on Enbrel 50 mg [place] weekly. She has not seen much additional improvement staying on the Enbrel. Back stiffness is worst every morning. More recently having some left shoulder and right elbow pain also but these are only stiff for less than 30 minutes in mornings. She has increased in skin peeling on fingertips of both hands she thinks this is related to washing and soap and has had similar problems before.   Previous HPI 07/14/2021 Breanna [name] is a 52 y.o. female here for follow up axial spondyloarthritis on Enbrel 50 mg [place] weekly. At last visit she had not yet seen a major change in symptoms with this treatment.  She feels symptoms are partially but noticeably improved since starting the Enbrel.  She describes nighttime awakenings from about 4 times  to 3 times per night.  Her morning stiffness is decreased to about 10 minutes duration.  Low back pain is overall decreased in severity but especially the pain after waking and stationary positions.  She had a recent neck epidural treatment that helped a lot with neck pain though she still notices some stiffness to range of motion and crepitus.   Previous HPI 04/14/21 Breanna [name] is a 52 y.o. 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 She has not noticed any large  change in symptoms after starting this medication. Still has morning stiffness and low back pain, some hand pain, and some night time awakenings. She reports increased left shoulder pain especially with raising her arm overhead more since the past 2-3 weeks. She had joint injection for this in July with a good improvement until now. She also tried increasing the lyrica medication for joint pain and some cervical spine degenerative changes with possible compression, but stopped this due to feeling poorly on this and worse at higher dose.   Previous HPI 12/28/20 Breanna [name] is a 52 y.o. female here for axial and peripheral spondyloarthritis. She previously saw a rheumatologist in [place] and took Humira for this but has been off treatment for at least a year. Review of previous rheumatology records indicates most recent treatment was on methotrexate and humira with reasonably good disease control. Apparently GI intolerance of methotrexate with titration to 25 mg PO weekly but tolerated at lower doses and labs monitoring were okay. She previously had joint involvement with pain in the lower thoracic spine, lumbar spine, right SI joint, and bilateral heels. She has previously reported joint pain dating back at least 20 years in duration. She moved from [place] due to work so has been off treatment since that time. She has noticed some increase in joint pain and stiffness but worst symptom increase is fatigue. She felt Humira was controlled symptoms well initially with loss of improvement, and addition of methotrexate did not improve much. She currently has neck and back pain mostly, at night and in the morning but also increase in neck pain during the day working. She recently underwent left shoulder injection  a month ago with improvement in pain there. Otherwise she has not taken any steroid medication recently.   Outside labs reviewed HLA-B27 pos   DMARD Hx Enbrel 2022-2023 Humira 2020-? MTX  2020-? NSAIDs - inadequate response   No Rheumatology ROS completed.   PMFS History:  Patient Active Problem List   Diagnosis Date Noted   Overweight (BMI 25.0-29.9) 11/29/2022   Pain in left hip 11/15/2022   Liver function test abnormality 02/20/2022   Lateral epicondylitis, right elbow 12/13/2021   Gastroesophageal reflux disease without esophagitis 04/14/2021   Menopause 04/14/2021   Pure hypercholesterolemia 04/14/2021   Stress incontinence (female)  04/14/2021   High risk medication use 12/28/2020   Primary osteoarthritis of right knee 12/21/2020   Morton's neuroma, right 12/21/2020   Chronic left shoulder pain 11/23/2020   DDD (degenerative disc disease), cervical 11/23/2020   Hyperlipidemia    Asthma    Axial spondyloarthritis (HCC)    Anxiety     Past Medical History:  Diagnosis Date   Ankylosing spondylitis (HCC)    Anxiety    Arthritis    Asthma    GERD (gastroesophageal reflux disease)    Hiatal hernia    Hyperlipidemia    Menopause    Shingles     Family History  Problem Relation  Age of Onset   Hypertension Mother    Heart attack Father    Healthy Brother    HIV Son    Lupus Cousin    Rheum arthritis Cousin    Past Surgical History:  Procedure Laterality Date   BREAST SURGERY     CARPAL TUNNEL RELEASE     epidural steroid injection  07/05/2021   in neck   GALLBLADDER SURGERY     KNEE SURGERY     ROTATOR CUFF REPAIR Left 05/08/2022   bicep tendon tear repair   TOTAL ABDOMINAL HYSTERECTOMY     Social History   Social History Narrative   Not on file   Immunization History  Administered Date(s) Administered   Influenza,inj,Quad PF,6+ Mos 03/21/2016, 08/01/2021, 01/24/2022   Influenza,inj,quad, With Preservative 02/02/2017   Influenza-Unspecified 03/05/2020   MMR 04/23/1986   Moderna Sars-Covid-2 Vaccination 06/12/2019, 08/22/2019, 03/30/2020   Polio, Unspecified 05/24/1978, 04/23/1986, 10/22/1986   Td (Adult),unspecified 05/24/1978,  04/23/1986, 07/23/1986   Tdap 05/05/2013, 06/08/2015     Objective: Vital Signs: There were no vitals taken for this visit.   Physical Exam   Musculoskeletal Exam: ***  CDAI Exam: CDAI Score: -- Patient Global: --; Provider Global: -- Swollen: --; Tender: -- Joint Exam 04/04/2023   No joint exam has been documented for this visit   There is currently no information documented on the homunculus. Go to the Rheumatology activity and complete the homunculus joint exam.  Investigation: No additional findings.  Imaging: No results found.  Recent Labs: Lab Results  Component Value Date   WBC 8.8 11/15/2022   HGB 15.3 11/15/2022   PLT 361 11/15/2022   NA 140 11/15/2022   K 4.3 11/15/2022   CL 101 11/15/2022   CO2 28 11/15/2022   GLUCOSE 94 11/15/2022   BUN 13 11/15/2022   CREATININE 0.75 11/15/2022   BILITOT 0.8 11/15/2022   ALKPHOS 91 07/01/2020   AST 38 (H) 11/15/2022   ALT 52 (H) 11/15/2022   PROT 7.3 11/15/2022   ALBUMIN 4.6 07/01/2020   CALCIUM 9.9 11/15/2022   GFRAA 87 09/16/2020   QFTBGOLDPLUS NEGATIVE 11/15/2022    Speciality Comments: No specialty comments available.  Procedures:  No procedures performed Allergies: Lexapro [escitalopram], Lipitor [atorvastatin], Meloxicam, Phendimetrazine tartrate, Aspirin, and Nickel   Assessment / Plan:     Visit Diagnoses: No diagnosis found.  ***  Orders: No orders of the defined types were placed in this encounter.  No orders of the defined types were placed in this encounter.    Follow-Up Instructions: No follow-ups on file.   Metta Clines, RT  Note - This record has been created using [employer].  Chart creation errors have been sought, but may not always  have been located. Such creation errors do not reflect on  the standard of medical care.

## 2023-04-04 ENCOUNTER — Ambulatory Visit: Payer: Commercial Managed Care - PPO | Admitting: Internal Medicine

## 2023-04-04 DIAGNOSIS — M4690 Unspecified inflammatory spondylopathy, site unspecified: Secondary | ICD-10-CM

## 2023-04-04 DIAGNOSIS — M25552 Pain in left hip: Secondary | ICD-10-CM

## 2023-04-04 DIAGNOSIS — Z79899 Other long term (current) drug therapy: Secondary | ICD-10-CM

## 2023-05-02 ENCOUNTER — Other Ambulatory Visit: Payer: Self-pay | Admitting: Medical Genetics

## 2023-05-09 ENCOUNTER — Other Ambulatory Visit: Payer: Self-pay | Admitting: Medical Genetics

## 2023-06-07 ENCOUNTER — Other Ambulatory Visit (HOSPITAL_COMMUNITY): Payer: Self-pay | Attending: Medical Genetics

## 2023-06-11 ENCOUNTER — Telehealth: Payer: Self-pay | Admitting: Internal Medicine

## 2023-06-11 NOTE — Telephone Encounter (Signed)
 Patient has not filled Cosentyx  since August 2024 and we have not set refills since July 2024. Multiple appointment cancellations and a no-show. Will not be renewing Cosentyx  PA at this time  Sherry Pennant, PharmD, MPH, BCPS, CPP Clinical Pharmacist (Rheumatology and Pulmonology)

## 2023-06-11 NOTE — Telephone Encounter (Signed)
 [facility] Prior Authorization Department left a voicemail regarding the PA request for Cosentyx PA [id number]. We need Dr. Gregary Cromer clinical notes to complete the request.  If you have any questions, please call back [phone]

## 2023-08-16 ENCOUNTER — Other Ambulatory Visit: Payer: Self-pay

## 2023-08-16 MED ORDER — ROSUVASTATIN CALCIUM 40 MG PO TABS: 40.0000 mg | ORAL_TABLET | Freq: Every day | ORAL | 0 refills | Status: DC

## 2023-11-11 ENCOUNTER — Other Ambulatory Visit: Payer: Self-pay | Admitting: Medical-Surgical

## 2023-11-12 ENCOUNTER — Other Ambulatory Visit: Payer: Self-pay

## 2023-11-12 DIAGNOSIS — F419 Anxiety disorder, unspecified: Secondary | ICD-10-CM

## 2023-11-12 MED ORDER — DULOXETINE HCL 60 MG PO CPEP: 60.0000 mg | ORAL_CAPSULE | Freq: Every day | ORAL | 0 refills | Status: DC

## 2023-11-12 MED ORDER — DULOXETINE HCL 30 MG PO CPEP: 30.0000 mg | ORAL_CAPSULE | Freq: Every day | ORAL | 0 refills | Status: DC

## 2023-11-12 NOTE — Telephone Encounter (Signed)
 Last seen: 12/16/22 Pls contact pt to schedule appt with [place].

## 2023-12-05 ENCOUNTER — Ambulatory Visit (INDEPENDENT_AMBULATORY_CARE_PROVIDER_SITE_OTHER): Admitting: Medical-Surgical

## 2023-12-05 ENCOUNTER — Encounter: Payer: Self-pay | Admitting: Medical-Surgical

## 2023-12-05 VITALS — BP 128/81 | HR 76 | Resp 20 | Ht 63.0 in | Wt 181.0 lb

## 2023-12-05 DIAGNOSIS — M4690 Unspecified inflammatory spondylopathy, site unspecified: Secondary | ICD-10-CM | POA: Diagnosis not present

## 2023-12-05 DIAGNOSIS — Z23 Encounter for immunization: Secondary | ICD-10-CM

## 2023-12-05 DIAGNOSIS — F419 Anxiety disorder, unspecified: Secondary | ICD-10-CM | POA: Diagnosis not present

## 2023-12-05 DIAGNOSIS — Z0000 Encounter for general adult medical examination without abnormal findings: Secondary | ICD-10-CM

## 2023-12-05 DIAGNOSIS — Z1231 Encounter for screening mammogram for malignant neoplasm of breast: Secondary | ICD-10-CM

## 2023-12-05 DIAGNOSIS — E782 Mixed hyperlipidemia: Secondary | ICD-10-CM

## 2023-12-05 DIAGNOSIS — E6609 Other obesity due to excess calories: Secondary | ICD-10-CM

## 2023-12-05 DIAGNOSIS — M5030 Other cervical disc degeneration, unspecified cervical region: Secondary | ICD-10-CM

## 2023-12-05 DIAGNOSIS — J4520 Mild intermittent asthma, uncomplicated: Secondary | ICD-10-CM

## 2023-12-05 DIAGNOSIS — Z6832 Body mass index (BMI) 32.0-32.9, adult: Secondary | ICD-10-CM

## 2023-12-05 DIAGNOSIS — E66811 Obesity, class 1: Secondary | ICD-10-CM

## 2023-12-05 MED ORDER — ALBUTEROL SULFATE HFA 108 (90 BASE) MCG/ACT IN AERS: 2.0000 | INHALATION_SPRAY | Freq: Four times a day (QID) | RESPIRATORY_TRACT | 11 refills | Status: AC | PRN

## 2023-12-05 MED ORDER — ROSUVASTATIN CALCIUM 40 MG PO TABS: ORAL_TABLET | ORAL | 3 refills | Status: AC

## 2023-12-05 MED ORDER — GABAPENTIN 300 MG PO CAPS: 300.0000 mg | ORAL_CAPSULE | Freq: Every day | ORAL | 3 refills | Status: AC

## 2023-12-05 MED ORDER — ZEPBOUND 2.5 MG/0.5ML ~~LOC~~ SOAJ: 2.5000 mg | SUBCUTANEOUS | 0 refills | Status: DC

## 2023-12-05 MED ORDER — DULOXETINE HCL 60 MG PO CPEP: 60.0000 mg | ORAL_CAPSULE | Freq: Every day | ORAL | 3 refills | Status: DC

## 2023-12-05 MED ORDER — DULOXETINE HCL 30 MG PO CPEP: 30.0000 mg | ORAL_CAPSULE | Freq: Every day | ORAL | 3 refills | Status: DC

## 2023-12-05 NOTE — Progress Notes (Signed)
 Complete physical exam  Patient: Breanna [name]   DOB: March 31, 1971   53 y.o. Female  MRN: [id number]  Subjective:    Chief Complaint  Patient presents with   Annual Exam   Weight Management Screening    Amyra Vantuyl is a 53 y.o. female who presents today for a complete physical exam. She reports consuming a general diet. Walking and bowling for exercise. Limited by right shoulder pain. She generally feels fairly well. She reports sleeping poorly. She does have additional problems to discuss today.   Most recent fall risk assessment:    12/05/2023    9:10 AM  Fall Risk   Falls in the past year? 0  Number falls in past yr: 0  Injury with Fall? 0  Risk for fall due to : No Fall Risks  Follow up Falls evaluation completed     Most recent depression screenings:    12/05/2023    9:11 AM 11/29/2022   10:33 AM  PHQ 2/9 Scores  PHQ - 2 Score 3 1  PHQ- 9 Score 10     Vision:Within last year and Dental: No current dental problems and Receives regular dental care    Patient Care Team: Willo Mini, NP as PCP - General (Nurse Practitioner)   Outpatient Medications Prior to Visit  Medication Sig   Ibuprofen-Acetaminophen 125-250 MG TABS as needed.   ondansetron  (ZOFRAN -ODT) 8 MG disintegrating tablet Take 1 tablet (8 mg total) by mouth every 8 (eight) hours as needed for nausea.   [DISCONTINUED] albuterol  (VENTOLIN  HFA) 108 (90 Base) MCG/ACT inhaler Inhale 2 puffs into the lungs every 6 (six) hours as needed for wheezing.   [DISCONTINUED] DULoxetine  (CYMBALTA ) 30 MG capsule Take 1 capsule (30 mg total) by mouth daily.   [DISCONTINUED] DULoxetine  (CYMBALTA ) 60 MG capsule Take 1 capsule (60 mg total) by mouth daily.   [DISCONTINUED] gabapentin  (NEURONTIN ) 300 MG capsule Take 1 capsule (300 mg total) by mouth at bedtime.   [DISCONTINUED] rosuvastatin  (CRESTOR ) 40 MG tablet TAKE 1 TABLET(40 MG) BY MOUTH DAILY   [DISCONTINUED] Semaglutide , 2 MG/DOSE, 8 MG/3ML SOPN Inject 2 mg as directed  once a week.   [DISCONTINUED] amoxicillin -clavulanate (AUGMENTIN ) 875-125 MG tablet Take 1 tablet by mouth 2 (two) times daily. (Patient not taking: Reported on 12/05/2023)   [DISCONTINUED] Secukinumab  (COSENTYX  SENSOREADY PEN) 150 MG/ML SOAJ INJECT 1 PEN UNDER THE SKIN EVERY 4 WEEKS (Patient not taking: Reported on 12/05/2023)   [DISCONTINUED] VITAMIN D, CHOLECALCIFEROL, PO Take by mouth. (Patient not taking: Reported on 12/05/2023)   No facility-administered medications prior to visit.    Review of Systems  Constitutional:  Negative for chills, fever, malaise/fatigue and weight loss.  HENT:  Negative for congestion, ear pain, hearing loss, sinus pain and sore throat.   Eyes:  Negative for blurred vision, photophobia and pain.  Respiratory:  Negative for cough, shortness of breath and wheezing.   Cardiovascular:  Negative for chest pain, palpitations and leg swelling.  Gastrointestinal:  Negative for abdominal pain, constipation, diarrhea, heartburn, nausea and vomiting.  Genitourinary:  Negative for dysuria, frequency and urgency.  Musculoskeletal:  Positive for joint pain (right shoulder) and neck pain. Negative for falls.  Skin:  Negative for itching and rash.  Neurological:  Negative for dizziness, weakness and headaches.  Endo/Heme/Allergies:  Negative for polydipsia. Does not bruise/bleed easily.  Psychiatric/Behavioral:  Negative for depression, substance abuse and suicidal ideas. The patient has insomnia. The patient is not nervous/anxious.      Objective:  BP 128/81 (BP Location: Left Arm, Cuff Size: Normal)   Pulse 76   Resp 20   Ht 5' 3 (1.6 m)   Wt 181 lb (82.1 kg)   SpO2 98%   BMI 32.06 kg/m    Physical Exam Constitutional:      General: She is not in acute distress.    Appearance: Normal appearance. She is not ill-appearing.  HENT:     Head: Normocephalic and atraumatic.     Right Ear: Tympanic membrane, ear canal and external ear normal. There is no impacted  cerumen.     Left Ear: Tympanic membrane, ear canal and external ear normal. There is no impacted cerumen.     Nose: Nose normal. No congestion or rhinorrhea.     Mouth/Throat:     Mouth: Mucous membranes are moist.     Pharynx: No oropharyngeal exudate or posterior oropharyngeal erythema.  Eyes:     General: No scleral icterus.       Right eye: No discharge.        Left eye: No discharge.     Extraocular Movements: Extraocular movements intact.     Conjunctiva/sclera: Conjunctivae normal.     Pupils: Pupils are equal, round, and reactive to light.  Neck:     Thyroid: No thyromegaly.     Vascular: No carotid bruit or JVD.     Trachea: Trachea normal.  Cardiovascular:     Rate and Rhythm: Normal rate and regular rhythm.     Pulses: Normal pulses.     Heart sounds: Normal heart sounds. No murmur heard.    No friction rub. No gallop.  Pulmonary:     Effort: Pulmonary effort is normal. No respiratory distress.     Breath sounds: Normal breath sounds. No wheezing.  Abdominal:     General: Bowel sounds are normal. There is no distension.     Palpations: Abdomen is soft.     Tenderness: There is no abdominal tenderness. There is no guarding.  Musculoskeletal:        General: Normal range of motion.     Cervical back: Normal range of motion and neck supple.  Lymphadenopathy:     Cervical: No cervical adenopathy.  Skin:    General: Skin is warm and dry.  Neurological:     Mental Status: She is alert and oriented to person, place, and time.     Cranial Nerves: No cranial nerve deficit.  Psychiatric:        Mood and Affect: Mood normal.        Behavior: Behavior normal.        Thought Content: Thought content normal.        Judgment: Judgment normal.      No results found for any visits on 12/05/23.     Assessment & Plan:    Routine Health Maintenance and Physical Exam  Immunization History  Administered Date(s) Administered   Influenza,inj,Quad PF,6+ Mos 03/21/2016,  08/01/2021, 01/24/2022   Influenza,inj,quad, With Preservative 02/02/2017   Influenza-Unspecified 03/05/2020   MMR 04/23/1986   Moderna Sars-Covid-2 Vaccination 06/12/2019, 08/22/2019, 03/30/2020   PNEUMOCOCCAL CONJUGATE-20 12/05/2023   Polio, Unspecified 05/24/1978, 04/23/1986, 10/22/1986   Td (Adult),unspecified 05/24/1978, 04/23/1986, 07/23/1986   Tdap 05/05/2013, 06/08/2015    Health Maintenance  Topic Date Due   Hepatitis B Vaccines (1 of 3 - 19+ 3-dose series) Never done   Zoster Vaccines- Shingrix (1 of 2) Never done   MAMMOGRAM  10/14/2023   COVID-19 Vaccine (4 -  2024-25 season) 12/21/2023 (Originally 02/04/2023)   INFLUENZA VACCINE  01/04/2024   DTaP/Tdap/Td (5 - Td or Tdap) 06/07/2025   Colonoscopy  11/11/2031   Pneumococcal Vaccine 37-59 Years old  Completed   Hepatitis C Screening  Completed   HIV Screening  Completed   HPV VACCINES  Aged Out   Meningococcal B Vaccine  Aged Out    Discussed health benefits of physical activity, and encouraged her to engage in regular exercise appropriate for her age and condition.  1. Annual physical exam (Primary) Checking labs as below.  Up-to-date on preventative care.  Wellness information provided with AVS. - Lipid panel - CBC with Differential/Platelet - CMP14+EGFR  2. Breast cancer screening by mammogram Mammogram ordered. - MM DIGITAL SCREENING BILATERAL; Future  3. Anxiety Stable on current regimen.  Continue Cymbalta  90 mg daily. - DULoxetine  (CYMBALTA ) 30 MG capsule; Take 1 capsule (30 mg total) by mouth daily.  Dispense: 90 capsule; Refill: 3 - DULoxetine  (CYMBALTA ) 60 MG capsule; Take 1 capsule (60 mg total) by mouth daily.  Dispense: 90 capsule; Refill: 3  4. Axial spondyloarthritis (HCC) Stable.  Continue gabapentin  300 mg at night. - gabapentin  (NEURONTIN ) 300 MG capsule; Take 1 capsule (300 mg total) by mouth at bedtime.  Dispense: 90 capsule; Refill: 3  5. DDD (degenerative disc disease), cervical Still  having some neck pain but this is more related to her right shoulder.  Continue gabapentin  as prescribed. - gabapentin  (NEURONTIN ) 300 MG capsule; Take 1 capsule (300 mg total) by mouth at bedtime.  Dispense: 90 capsule; Refill: 3  6. Mild intermittent asthma without complication No current concerns but likes to have albuterol  on hand for rescue.  Refilling albuterol  inhaler. - albuterol  (VENTOLIN  HFA) 108 (90 Base) MCG/ACT inhaler; Inhale 2 puffs into the lungs every 6 (six) hours as needed for wheezing.  Dispense: 18 g; Refill: 11  7. Class 1 obesity due to excess calories with serious comorbidity and body mass index (BMI) of 32.0 to 32.9 in adult Checking TSH and hemoglobin A1c.  Previously used Ozempic  with good results however this is no longer covered by her insurance.  Has also had some intolerance since restarting Ozempic  and working her way up to the 2 mg weekly dose.  She is still doing regular intentional exercise. Still working on a low calorie diet with a focus on increased protein. Unfortunately, she has still gained weight.  Patient has tried dietary and lifestyle modifications, including caloric restrictions, weight loss behavior, modifications, and increased physical activity (at least 150 minutes per week) for more than 6 months. The patient has been unable to lose 5% of their body weight. It is medically necessary for the patient to add pharmacotherapy to their obesity treatment plan at this time. The patient will continue lifestyle modifications, physical activity, and caloric restrictions while on therapy. The current BMI is 32.06 and body weight is 181 lbs prior to starting medication to treat their obesity. Pharmacotherapy is recommended following AACE guidelines. Patient is not taking another GLP-1 receptor agonist and is not pregnant/lactating. There are no contraindications. Starting Zepbound. - TSH - Hemoglobin A1c - tirzepatide (ZEPBOUND) 2.5 MG/0.5ML Pen; Inject 2.5 mg into  the skin once a week.  Dispense: 2 mL; Refill: 0  8. Mixed hyperlipidemia Checking labs. Continue Crestor .  - Lipid panel - CMP14+EGFR - rosuvastatin  (CRESTOR ) 40 MG tablet; TAKE 1 TABLET(40 MG) BY MOUTH DAILY  Dispense: 90 tablet; Refill: 3 - tirzepatide (ZEPBOUND) 2.5 MG/0.5ML Pen; Inject 2.5 mg into the  skin once a week.  Dispense: 2 mL; Refill: 0  9. Need for vaccination with 20-polyvalent pneumococcal conjugate vaccine Prevnar 20 given today. - Pneumococcal conjugate vaccine 20-valent (Prevnar 20)     Raeleigh Guinn, NP

## 2023-12-05 NOTE — Patient Instructions (Signed)
 Preventive Care 16-53 Years Old, Female  Preventive care refers to lifestyle choices and visits with your health care provider that can promote health and wellness. Preventive care visits are also called wellness exams.  What can I expect for my preventive care visit?  Counseling  Your health care provider may ask you questions about your:  Medical history, including:  Past medical problems.  Family medical history.  Pregnancy history.  Current health, including:  Menstrual cycle.  Method of birth control.  Emotional well-being.  Home life and relationship well-being.  Sexual activity and sexual health.  Lifestyle, including:  Alcohol, nicotine or tobacco, and drug use.  Access to firearms.  Diet, exercise, and sleep habits.  Work and work Astronomer.  Sunscreen use.  Safety issues such as seatbelt and bike helmet use.  Physical exam  Your health care provider will check your:  Height and weight. These may be used to calculate your BMI (body mass index). BMI is a measurement that tells if you are at a healthy weight.  Waist circumference. This measures the distance around your waistline. This measurement also tells if you are at a healthy weight and may help predict your risk of certain diseases, such as type 2 diabetes and high blood pressure.  Heart rate and blood pressure.  Body temperature.  Skin for abnormal spots.  What immunizations do I need?    Vaccines are usually given at various ages, according to a schedule. Your health care provider will recommend vaccines for you based on your age, medical history, and lifestyle or other factors, such as travel or where you work.  What tests do I need?  Screening  Your health care provider may recommend screening tests for certain conditions. This may include:  Lipid and cholesterol levels.  Diabetes screening. This is done by checking your blood sugar (glucose) after you have not eaten for a while (fasting).  Pelvic exam and Pap test.  Hepatitis B test.  Hepatitis C  test.  HIV (human immunodeficiency virus) test.  STI (sexually transmitted infection) testing, if you are at risk.  Lung cancer screening.  Colorectal cancer screening.  Mammogram. Talk with your health care provider about when you should start having regular mammograms. This may depend on whether you have a family history of breast cancer.  BRCA-related cancer screening. This may be done if you have a family history of breast, ovarian, tubal, or peritoneal cancers.  Bone density scan. This is done to screen for osteoporosis.  Talk with your health care provider about your test results, treatment options, and if necessary, the need for more tests.  Follow these instructions at home:  Eating and drinking    Eat a diet that includes fresh fruits and vegetables, whole grains, lean protein, and low-fat dairy products.  Take vitamin and mineral supplements as recommended by your health care provider.  Do not drink alcohol if:  Your health care provider tells you not to drink.  You are pregnant, may be pregnant, or are planning to become pregnant.  If you drink alcohol:  Limit how much you have to 0-1 drink a day.  Know how much alcohol is in your drink. In the [place] one drink equals one 12 oz bottle of beer (355 mL), one 5 oz glass of wine (148 mL), or one 1 oz glass of hard liquor (44 mL).  Lifestyle  Brush your teeth every morning and night with fluoride toothpaste. Floss one time each day.  Exercise for at least  30 minutes 5 or more days each week.  Do not use any products that contain nicotine or tobacco. These products include cigarettes, chewing tobacco, and vaping devices, such as e-cigarettes. If you need help quitting, ask your health care provider.  Do not use drugs.  If you are sexually active, practice safe sex. Use a condom or other form of protection to prevent STIs.  If you do not wish to become pregnant, use a form of birth control. If you plan to become pregnant, see your health care provider for a  prepregnancy visit.  Take aspirin only as told by your health care provider. Make sure that you understand how much to take and what form to take. Work with your health care provider to find out whether it is safe and beneficial for you to take aspirin daily.  Find healthy ways to manage stress, such as:  Meditation, yoga, or listening to music.  Journaling.  Talking to a trusted person.  Spending time with friends and family.  Minimize exposure to UV radiation to reduce your risk of skin cancer.  Safety  Always wear your seat belt while driving or riding in a vehicle.  Do not drive:  If you have been drinking alcohol. Do not ride with someone who has been drinking.  When you are tired or distracted.  While texting.  If you have been using any mind-altering substances or drugs.  Wear a helmet and other protective equipment during sports activities.  If you have firearms in your house, make sure you follow all gun safety procedures.  Seek help if you have been physically or sexually abused.  What's next?  Visit your health care provider once a year for an annual wellness visit.  Ask your health care provider how often you should have your eyes and teeth checked.  Stay up to date on all vaccines.  This information is not intended to replace advice given to you by your health care provider. Make sure you discuss any questions you have with your health care provider.  Document Revised: 11/17/2020 Document Reviewed: 11/17/2020  Elsevier Patient Education  2024 [employer].

## 2023-12-06 ENCOUNTER — Telehealth: Payer: Self-pay

## 2023-12-06 ENCOUNTER — Ambulatory Visit: Payer: Self-pay | Admitting: Medical-Surgical

## 2023-12-06 ENCOUNTER — Other Ambulatory Visit (HOSPITAL_COMMUNITY): Payer: Self-pay

## 2023-12-06 DIAGNOSIS — E66811 Obesity, class 1: Secondary | ICD-10-CM

## 2023-12-06 LAB — CBC WITH DIFFERENTIAL/PLATELET
Basophils Absolute: 0 10*3/uL (ref 0.0–0.2)
Basos: 0 %
EOS (ABSOLUTE): 0.1 10*3/uL (ref 0.0–0.4)
Eos: 1 %
Hematocrit: 45 % (ref 34.0–46.6)
Hemoglobin: 14.5 g/dL (ref 11.1–15.9)
Immature Grans (Abs): 0 10*3/uL (ref 0.0–0.1)
Immature Granulocytes: 0 %
Lymphocytes Absolute: 3 10*3/uL (ref 0.7–3.1)
Lymphs: 33 %
MCH: 29.2 pg (ref 26.6–33.0)
MCHC: 32.2 g/dL (ref 31.5–35.7)
MCV: 91 fL (ref 79–97)
Monocytes Absolute: 0.6 10*3/uL (ref 0.1–0.9)
Monocytes: 6 %
Neutrophils Absolute: 5.4 10*3/uL (ref 1.4–7.0)
Neutrophils: 60 %
Platelets: 341 10*3/uL (ref 150–450)
RBC: 4.97 x10E6/uL (ref 3.77–5.28)
RDW: 14.4 % (ref 11.7–15.4)
WBC: 9.1 10*3/uL (ref 3.4–10.8)

## 2023-12-06 LAB — CMP14+EGFR
ALT: 69 IU/L — ABNORMAL HIGH (ref 0–32)
AST: 28 IU/L (ref 0–40)
Albumin: 4.5 g/dL (ref 3.8–4.9)
Alkaline Phosphatase: 90 IU/L (ref 44–121)
BUN/Creatinine Ratio: 27 — ABNORMAL HIGH (ref 9–23)
BUN: 20 mg/dL (ref 6–24)
Bilirubin Total: 0.6 mg/dL (ref 0.0–1.2)
CO2: 24 mmol/L (ref 20–29)
Calcium: 9.4 mg/dL (ref 8.7–10.2)
Chloride: 101 mmol/L (ref 96–106)
Creatinine, Ser: 0.75 mg/dL (ref 0.57–1.00)
Globulin, Total: 2.4 g/dL (ref 1.5–4.5)
Glucose: 96 mg/dL (ref 70–99)
Potassium: 4.5 mmol/L (ref 3.5–5.2)
Sodium: 140 mmol/L (ref 134–144)
Total Protein: 6.9 g/dL (ref 6.0–8.5)
eGFR: 96 mL/min/{1.73_m2} (ref >59)

## 2023-12-06 LAB — LIPID PANEL
Chol/HDL Ratio: 2.3 ratio (ref 0.0–4.4)
Cholesterol, Total: 169 mg/dL (ref 100–199)
HDL: 73 mg/dL (ref >39)
LDL Chol Calc (NIH): 81 mg/dL (ref 0–99)
Triglycerides: 78 mg/dL (ref 0–149)
VLDL Cholesterol Cal: 15 mg/dL (ref 5–40)

## 2023-12-06 LAB — TSH: TSH: 3.07 u[IU]/mL (ref 0.450–4.500)

## 2023-12-06 LAB — HEMOGLOBIN A1C
Est. average glucose Bld gHb Est-mCnc: 114 mg/dL
Hgb A1c MFr Bld: 5.6 % (ref 4.8–5.6)

## 2023-12-06 MED ORDER — WEGOVY 0.25 MG/0.5ML ~~LOC~~ SOAJ: 0.2500 mg | SUBCUTANEOUS | 0 refills | Status: DC

## 2023-12-06 NOTE — Telephone Encounter (Signed)
 Pharmacy Patient Advocate Encounter   Received notification from Patient Advice Request messages that prior authorization for Zepbound 2.5mg /0.16ml is required/requested.   Insurance verification completed.   The patient is insured through [employer] [facility] .   Per test claim: Product/Service not covered - Plan/Benefit exclusion - Use Orlistat, Qsymia, Saxenda, Wegovy   Tried a test claim for Wegovy  and it will require a prior auth.     Please advise.

## 2023-12-12 ENCOUNTER — Encounter: Admitting: Medical-Surgical

## 2023-12-18 ENCOUNTER — Telehealth: Payer: Self-pay

## 2023-12-18 ENCOUNTER — Other Ambulatory Visit (HOSPITAL_COMMUNITY): Payer: Self-pay

## 2023-12-18 NOTE — Telephone Encounter (Signed)
 Pharmacy Patient Advocate Encounter   Received notification from Onbase that prior authorization for Wegovy  0.25 is required/requested.   Insurance verification completed.   The patient is insured through [employer] [facility] .   Per test claim: PA required; PA started via CoverMyMeds. KEY [id number] . Waiting for clinical questions to populate.

## 2023-12-18 NOTE — Telephone Encounter (Signed)
 Error

## 2023-12-18 NOTE — Telephone Encounter (Signed)
 Placed a call to the insurance at [phone] and per the rep we will need to send over chart notes containing the patient's BMI.   Faxed to [phone]

## 2023-12-19 NOTE — Telephone Encounter (Signed)
 Pharmacy Patient Advocate Encounter  Received notification from [facility] that Prior Authorization for WEGOVY  has been APPROVED from 12/18/23 to 07/20/24   PA #/Case ID/Reference #: [id number] [facility]

## 2024-01-01 ENCOUNTER — Other Ambulatory Visit: Payer: Self-pay

## 2024-01-01 MED ORDER — ONDANSETRON 8 MG PO TBDP: 8.0000 mg | ORAL_TABLET | Freq: Three times a day (TID) | ORAL | 0 refills | Status: AC | PRN

## 2024-01-03 ENCOUNTER — Telehealth: Payer: Self-pay

## 2024-01-03 ENCOUNTER — Other Ambulatory Visit (HOSPITAL_COMMUNITY): Payer: Self-pay

## 2024-01-03 ENCOUNTER — Other Ambulatory Visit (HOSPITAL_BASED_OUTPATIENT_CLINIC_OR_DEPARTMENT_OTHER): Payer: Self-pay

## 2024-01-03 NOTE — Telephone Encounter (Signed)
 Pharmacy Patient Advocate Encounter   Received notification from CoverMyMeds that prior authorization for Ondansetron  8mg  ODT tabs is required/requested.   Insurance verification completed.   The patient is insured through [facility] .   Per test claim: PA required; PA submitted to above mentioned insurance via CoverMyMeds Key/confirmation #/EOC [id number] Status is pending

## 2024-01-03 NOTE — Telephone Encounter (Signed)
 Pharmacy Patient Advocate Encounter  Received notification from [facility] that Prior Authorization for Ondansetron  8mg  ODT tabs has been DENIED.  See denial reason below. No denial letter attached in CMM. Will attach denial letter to Media tab once received.   PA #/Case ID/Reference #: [id number]   The plan covers up to 18 tabs per 28 days. The PA was denied for the additional quantity.

## 2024-01-16 ENCOUNTER — Encounter: Payer: Self-pay | Admitting: Medical-Surgical

## 2024-01-16 ENCOUNTER — Other Ambulatory Visit: Payer: Self-pay | Admitting: Medical-Surgical

## 2024-01-16 MED ORDER — WEGOVY 0.5 MG/0.5ML ~~LOC~~ SOAJ
0.5000 mg | SUBCUTANEOUS | 0 refills | Status: DC
Start: 2024-01-16 — End: 2024-01-17

## 2024-01-17 ENCOUNTER — Other Ambulatory Visit: Payer: Self-pay

## 2024-01-17 MED ORDER — WEGOVY 0.5 MG/0.5ML ~~LOC~~ SOAJ: 0.5000 mg | SUBCUTANEOUS | 0 refills | Status: DC

## 2024-01-17 NOTE — Telephone Encounter (Signed)
 Message sent to patient via Mychart.

## 2024-02-05 ENCOUNTER — Encounter: Payer: Self-pay | Admitting: Sports Medicine

## 2024-02-21 ENCOUNTER — Ambulatory Visit

## 2024-03-15 ENCOUNTER — Other Ambulatory Visit: Payer: Self-pay | Admitting: Medical-Surgical

## 2024-03-16 ENCOUNTER — Encounter: Payer: Self-pay | Admitting: Medical-Surgical

## 2024-03-17 ENCOUNTER — Other Ambulatory Visit: Payer: Self-pay | Admitting: Medical-Surgical

## 2024-03-17 MED ORDER — WEGOVY 0.5 MG/0.5ML ~~LOC~~ SOAJ: 0.5000 mg | SUBCUTANEOUS | 1 refills | Status: DC

## 2024-04-04 ENCOUNTER — Other Ambulatory Visit: Payer: Self-pay | Admitting: Medical Genetics

## 2024-04-04 DIAGNOSIS — Z006 Encounter for examination for normal comparison and control in clinical research program: Secondary | ICD-10-CM

## 2024-04-16 ENCOUNTER — Other Ambulatory Visit: Payer: Self-pay | Admitting: Radiology

## 2024-04-17 ENCOUNTER — Ambulatory Visit

## 2024-04-17 DIAGNOSIS — Z1231 Encounter for screening mammogram for malignant neoplasm of breast: Secondary | ICD-10-CM

## 2024-04-25 ENCOUNTER — Telehealth: Payer: Self-pay

## 2024-04-25 NOTE — Telephone Encounter (Signed)
 Copied from CRM #[id number]. Topic: General - Other >> Apr 24, 2024  3:30 PM Santiya F wrote: Reason for CRM: Clayborne with [facility] is calling in requesting an update on the medical clearance form she sent over on 03/28/24. She is requesting that be completed and sent back.

## 2024-04-25 NOTE — Telephone Encounter (Signed)
 Sent fax to [facility] [phone] for Surgery Clearance form for neck surgery on 05/21/2024.

## 2024-04-30 ENCOUNTER — Telehealth: Payer: Self-pay | Admitting: Family Medicine

## 2024-04-30 NOTE — Telephone Encounter (Signed)
 Copied from CRM #[id number]. Topic: General - Other >> Apr 30, 2024 11:13 AM Wess RAMAN wrote: Reason for CRM: Clayborne from [facility] would like to check the status of Clearance letter sent in on 04/28/24  Callback #: [phone] opt 5 Fax #: [phone]

## 2024-05-05 ENCOUNTER — Telehealth: Payer: Self-pay

## 2024-05-05 NOTE — Telephone Encounter (Signed)
 Copied from CRM #[id number]. Topic: General - Other >> May 05, 2024  1:27 PM Aleatha C wrote: Reason for CRM: Clayborne from [facility] would like to check the status of Clearance letter sent in on 04/28/24   Callback #: [phone] opt 5 Fax #: [phone]   Surgery date is 12/17 and need it signed off as soon  as possible

## 2024-05-07 NOTE — Telephone Encounter (Signed)
 Clayborne from [facility] stated patient is having surgery on 12/17 and they need the surgical clearance form signed and sent back asap. Per CAL request to refax to [phone] and would place in providers box.

## 2024-05-08 NOTE — Telephone Encounter (Signed)
 Spoke with Clayborne - surgical clearance form  has been received and is in patient chart.

## 2024-05-22 ENCOUNTER — Ambulatory Visit

## 2024-05-22 NOTE — Telephone Encounter (Signed)
 Not our patient

## 2024-06-06 ENCOUNTER — Ambulatory Visit: Admitting: Medical-Surgical

## 2024-06-19 ENCOUNTER — Encounter: Payer: Self-pay | Admitting: Medical-Surgical

## 2024-06-19 ENCOUNTER — Ambulatory Visit: Admitting: Medical-Surgical

## 2024-06-19 VITALS — BP 125/74 | HR 80 | Temp 99.2°F | Resp 20 | Ht 63.0 in | Wt 202.0 lb

## 2024-06-19 DIAGNOSIS — E782 Mixed hyperlipidemia: Secondary | ICD-10-CM

## 2024-06-19 DIAGNOSIS — Z6835 Body mass index (BMI) 35.0-35.9, adult: Secondary | ICD-10-CM | POA: Diagnosis not present

## 2024-06-19 DIAGNOSIS — E66812 Obesity, class 2: Secondary | ICD-10-CM

## 2024-06-19 DIAGNOSIS — F419 Anxiety disorder, unspecified: Secondary | ICD-10-CM

## 2024-06-19 MED ORDER — WEGOVY 1 MG/0.5ML ~~LOC~~ SOAJ: 1.0000 mg | SUBCUTANEOUS | 0 refills | Status: AC

## 2024-06-19 MED ORDER — DESVENLAFAXINE SUCCINATE ER 50 MG PO TB24: 50.0000 mg | ORAL_TABLET | Freq: Every day | ORAL | 3 refills | Status: AC

## 2024-06-19 NOTE — Progress Notes (Signed)
 "       Established patient visit   History of Present Illness   Discussed the use of AI scribe software for clinical note transcription with the patient, who gave verbal consent to proceed.  History of Present Illness   Breanna [name] is a 54 year old female who presents with concerns about weight management and medication efficacy.  Weight gain and obesity management - Progressive weight gain despite adherence to a healthy diet and lifestyle - Concerned about regaining prior weight - Currently on Wegovy  0.5 mg, restarted May 25, 2024, after interruption for neck surgery on May 21, 2024  Elevated blood pressure - Recent elevated blood pressure readings - Attributes increased blood pressure to heightened stress levels  Depressive symptoms and medication efficacy - Takes Cymbalta  60 mg daily without perceived benefit - Previously trialed Cymbalta  90 mg without clear improvement - Interested in alternative antidepressant therapy - Denies thoughts of self-harm or harm to others  Chronic pain and postoperative recovery - Uses tizanidine at bedtime and gabapentin  for pain management - Recently discontinued other pain medications - Intends to taper tizanidine as she adapts to new cervical hardware following neck surgery on May 21, 2024 - Currently recovering from recent neck surgery  Physical Exam   Physical Exam Vitals reviewed.  Constitutional:      General: She is not in acute distress.    Appearance: Normal appearance. She is obese. She is not ill-appearing.  HENT:     Head: Normocephalic and atraumatic.  Cardiovascular:     Rate and Rhythm: Normal rate and regular rhythm.     Pulses: Normal pulses.     Heart sounds: Normal heart sounds. No murmur heard.    No friction rub. No gallop.  Pulmonary:     Effort: Pulmonary effort is normal. No respiratory distress.     Breath sounds: Normal breath sounds. No wheezing.  Skin:    General: Skin is warm and dry.   Neurological:     Mental Status: She is alert and oriented to person, place, and time.  Psychiatric:        Mood and Affect: Mood normal.        Behavior: Behavior normal.        Thought Content: Thought content normal.        Judgment: Judgment normal.    Assessment & Plan   Class 2 obesity with serious comorbidity, BMI 35.0-35.9 Weight gain despite adherence to previous regimen. Recent surgery affected Wegovy  dosing and efficacy. Considering dose increase to enhance weight loss. - Increased Wegovy  to 1 mg weekly. - Monitor tolerance to increased Wegovy  dose. - If still no progress after reaching max dose Wegovy , consider adding Metformin.  Depression and anxiety Cymbalta  60 mg daily ineffective. Previous 90 mg trial also ineffective. Switching to Pristiq  due to lower weight gain risk. Immediate switch planned due to similar drug class. - Initiated Pristiq  50 mg daily. - Reassess in 3-4 weeks to evaluate efficacy and consider increasing to 100 mg if needed.  Chronic cervicalgia post neck surgery Managed with tizanidine and gabapentin . Improvement with cervical pillow and TENS unit. Limited progress in physical therapy. - Continue tizanidine once daily at bedtime. - Continue gabapentin  as prescribed. - Continue physical therapy sessions. - Consider use of cervical pillow and TENS unit for additional support.     Follow up   Return in about 4 weeks (around 07/17/2024) for mood follow up. __________________________________ Zada FREDRIK Palin, DNP, APRN, FNP-BC Primary Care [facility] Sports  Medicine [facility] "

## 2024-07-17 ENCOUNTER — Ambulatory Visit: Admitting: Medical-Surgical
